# Patient Record
Sex: Female | Born: 1965 | Race: White | Hispanic: No | Marital: Married | Smoking: Never smoker
Health system: Southern US, Community
[De-identification: ages and names within clinical notes are randomized; demographics above are authoritative.]

## PROBLEM LIST (undated history)

## (undated) DIAGNOSIS — R55 Syncope and collapse: Secondary | ICD-10-CM

## (undated) HISTORY — DX: Syncope and collapse: R55

## (undated) HISTORY — PX: PACEMAKER INSERTION: SHX728

## (undated) HISTORY — PX: POCKET REVISION: SHX6032

---

## 2005-10-27 ENCOUNTER — Ambulatory Visit (HOSPITAL_COMMUNITY): Admission: RE | Admit: 2005-10-27 | Discharge: 2005-10-27 | Payer: Self-pay | Admitting: Surgery

## 2005-11-02 ENCOUNTER — Ambulatory Visit: Payer: Self-pay | Admitting: Internal Medicine

## 2005-11-03 ENCOUNTER — Ambulatory Visit: Payer: Self-pay | Admitting: Internal Medicine

## 2005-11-03 ENCOUNTER — Inpatient Hospital Stay (HOSPITAL_COMMUNITY): Admission: RE | Admit: 2005-11-03 | Discharge: 2005-11-04 | Payer: Self-pay | Admitting: Surgery

## 2006-01-11 ENCOUNTER — Ambulatory Visit: Payer: Self-pay | Admitting: Internal Medicine

## 2006-04-24 ENCOUNTER — Ambulatory Visit: Payer: Self-pay | Admitting: Internal Medicine

## 2006-10-17 ENCOUNTER — Ambulatory Visit: Payer: Self-pay | Admitting: Internal Medicine

## 2007-08-25 IMAGING — US US MISC SOFT TISSUE
1 series · 14 of 16 positions shown · non-contrast
Comparison: none

CLINICAL DATA: 39-year-old female with left pacer site focal redness, induration and swelling with pain for 2 days.  This is to exclude superficial abscess.
ULTRASOUND MISCELLANEOUS SOFT TISSUE LEFT CHEST PACER SITE:
TECHNIQUE: Superficial ultrasound is performed of the left chest pacer site with a linear probe.

[Series 1: unknown · 0.11mm/px · 14 of 24 slices shown]
[im 1/24]
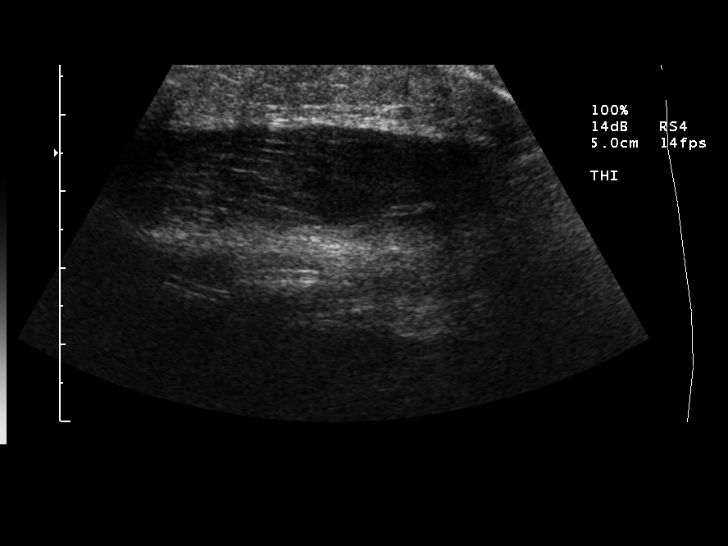
[im 2/24]
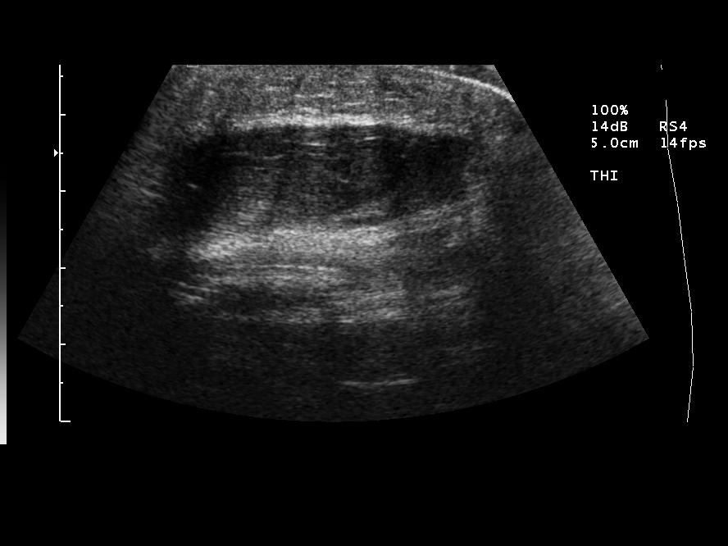
[im 4/24]
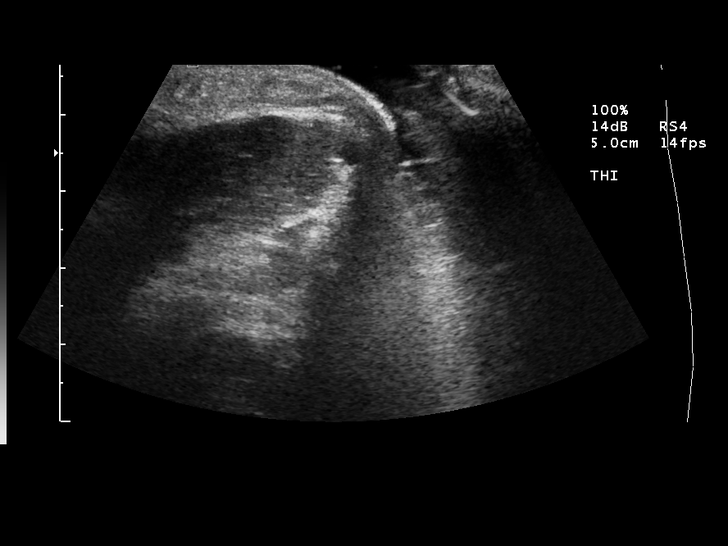
[im 7/24]
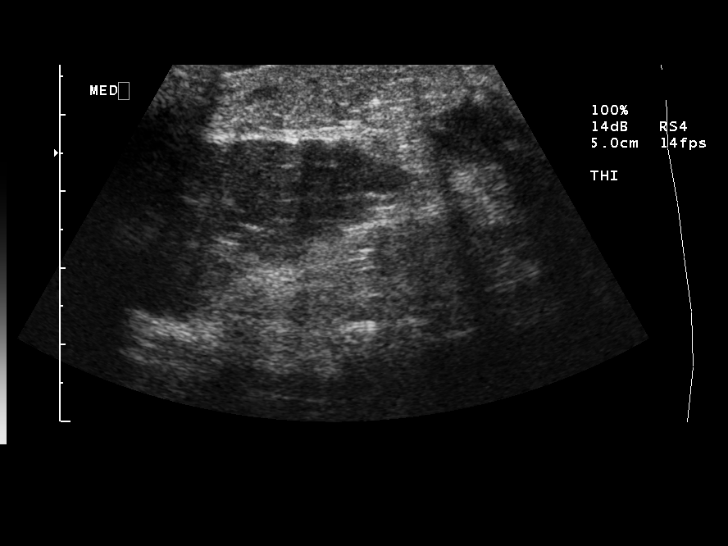
[im 8/24]
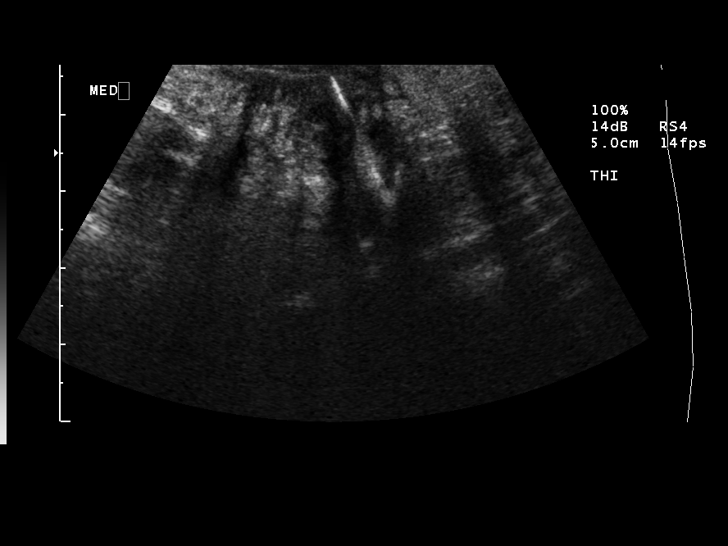
[im 10/24]
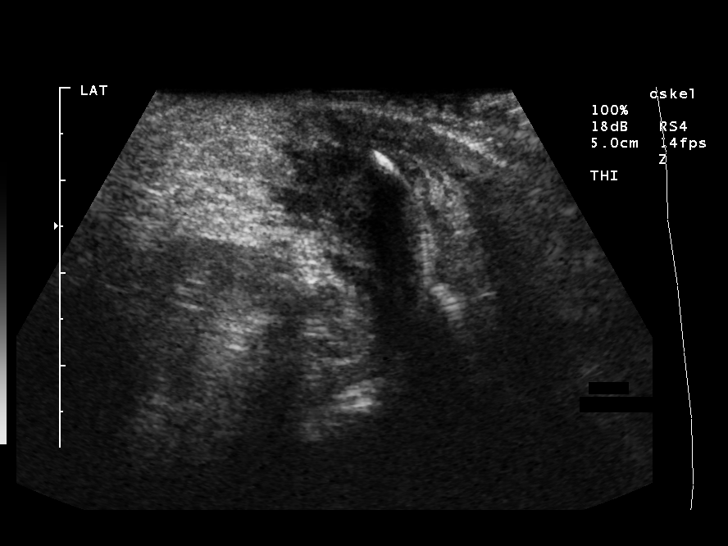
[im 11/24]
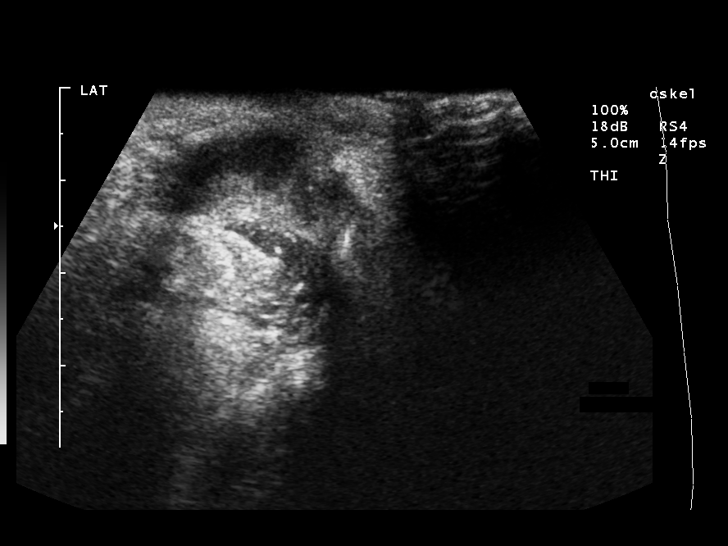
[im 13/24]
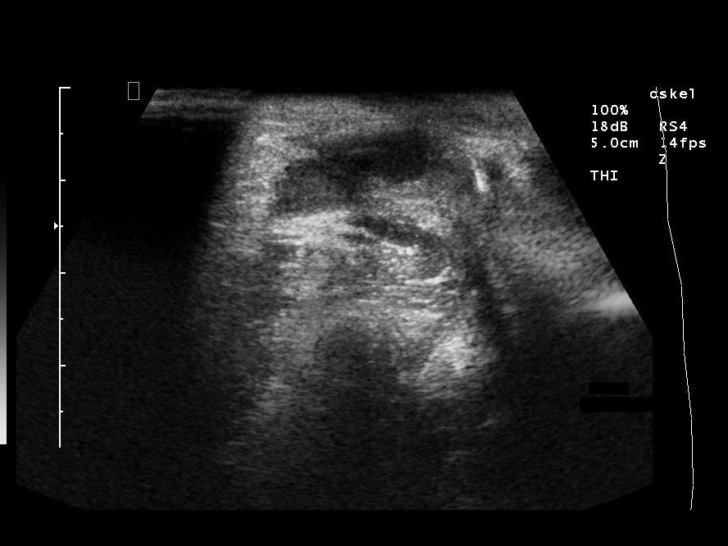
[im 14/24]
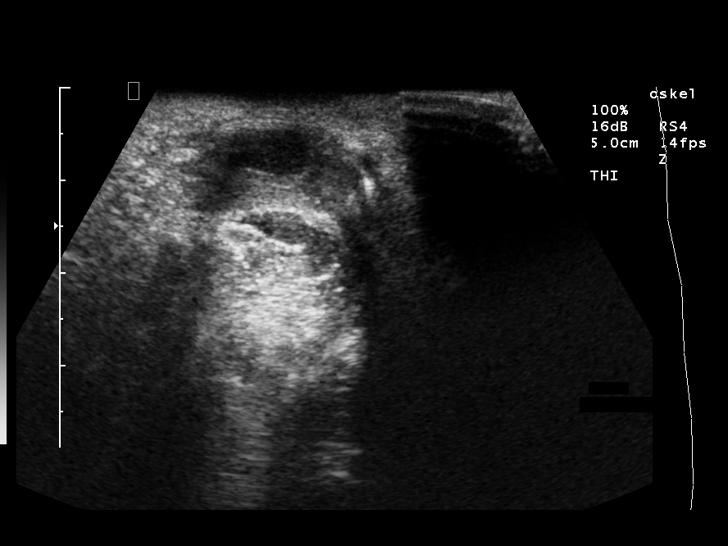
[im 16/24]
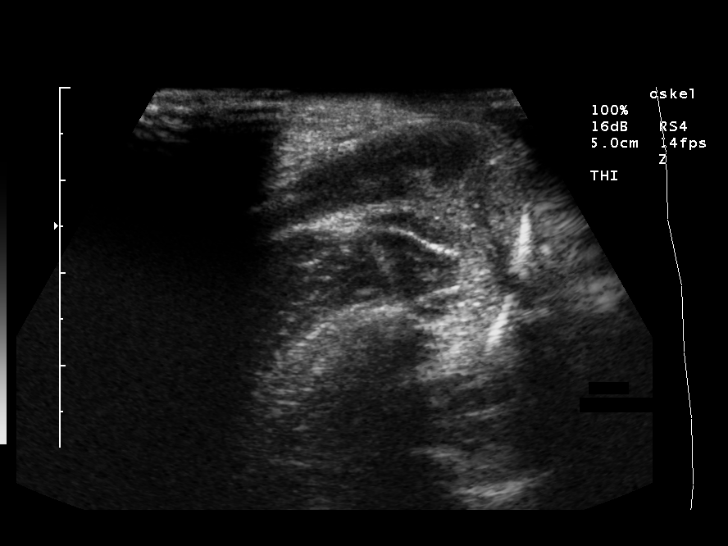
[im 19/24]
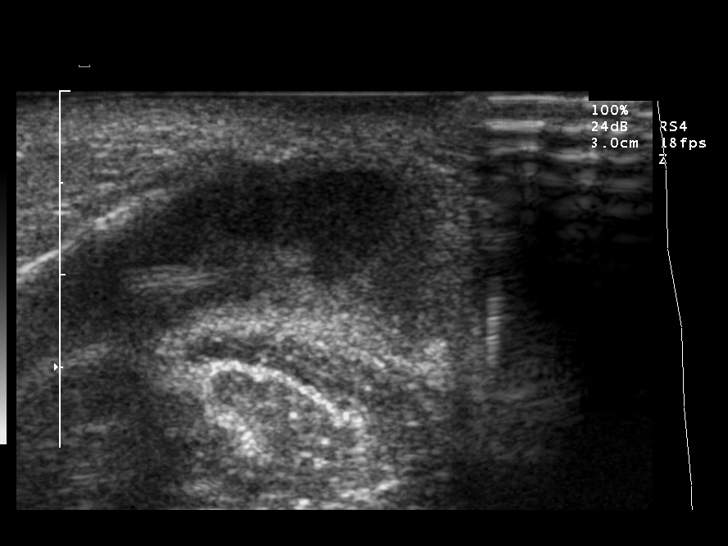
[im 20/24]
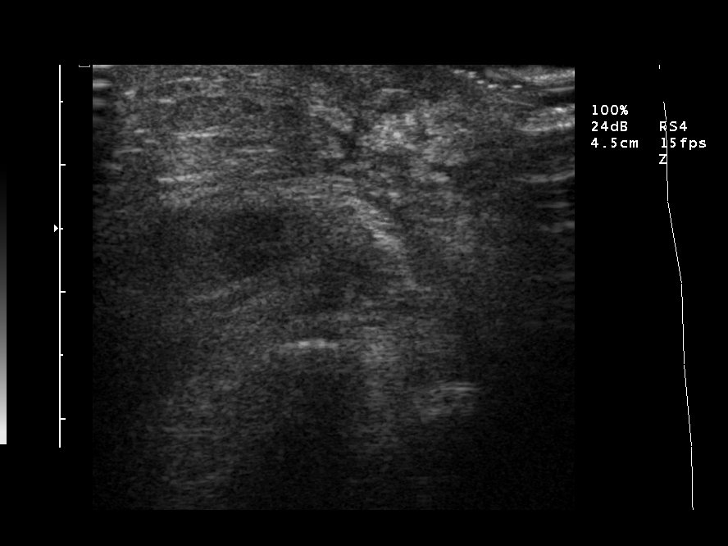
[im 22/24]
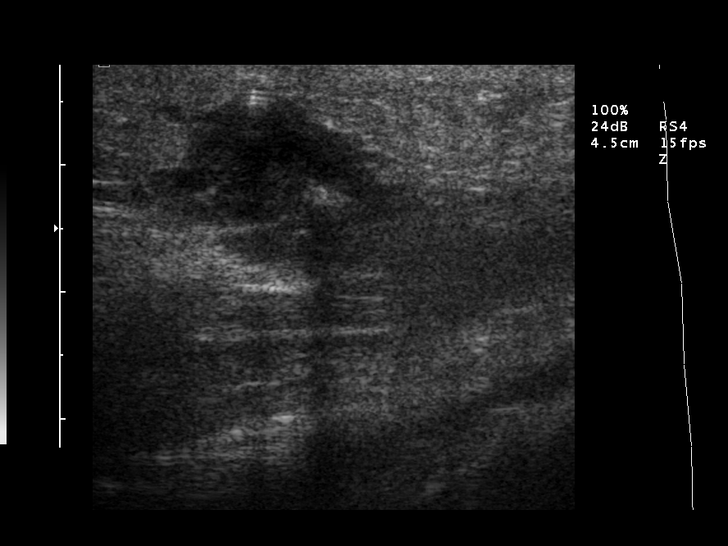
[im 24/24]
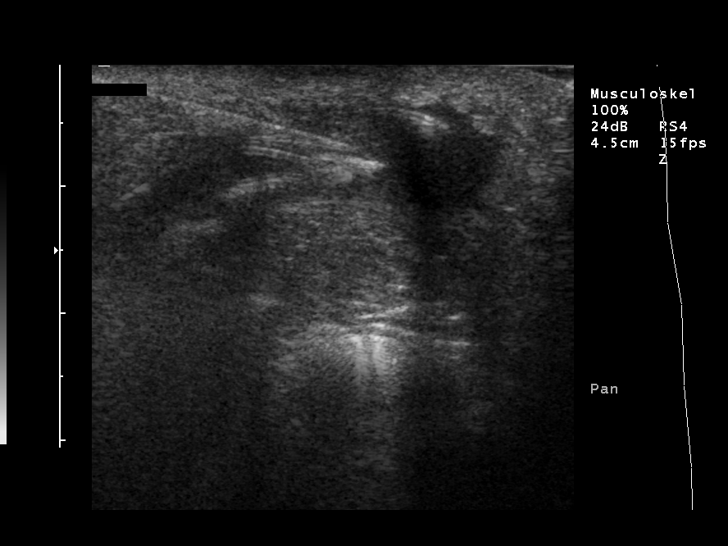

[14 of 16 positions shown; findings below may reference images not displayed]

FINDINGS: The pacemaker is subpectoral in location.  The echogenic pacer wires are easy to follow and lateral to the pacemaker there is a focal fluid collection surrounding both pacer wires within the subcutaneous layer beneath the skin and overlying the axillary portion of the left pectoralis major muscle.  This is directly beneath the area of skin induration and redness consistent with a subcutaneous abscess.
IMPRESSION: Superficial subcutaneous left chest abscess just lateral to the pacemaker site and surrounding both of the pacer wires.  The collection will be aspirated to obtain a sample.  
Note:  These findings were discussed with DR. Shalu Joynal.

## 2008-06-19 ENCOUNTER — Ambulatory Visit: Payer: Self-pay | Admitting: Internal Medicine

## 2010-08-04 ENCOUNTER — Encounter: Payer: Self-pay | Admitting: Internal Medicine

## 2010-08-04 ENCOUNTER — Ambulatory Visit
Admission: RE | Admit: 2010-08-04 | Discharge: 2010-08-04 | Payer: Self-pay | Source: Home / Self Care | Attending: Internal Medicine | Admitting: Internal Medicine

## 2010-08-04 DIAGNOSIS — R55 Syncope and collapse: Secondary | ICD-10-CM | POA: Insufficient documentation

## 2010-08-11 NOTE — Assessment & Plan Note (Signed)
Summary: [facility]  Medications Added * BIRTH CONTROL as directed        Visit Type:  Follow-up Primary Provider:  [facility]  CC:  no complaints.  History of Present Illness: Wanda Barrera returns today for followup. She is a pleasant 45 yo woman with a h/o neurally mediated syncope, who I initially met after she had developed a PPM pocket infection at another institution and underwent system exctraction.  She has not had any recent episodes of syncope. She continues on a high sodium diet. She remains active.  No peripheral edema.  Current Medications (verified): 1)  Fludrocortisone Acetate 0.1 Mg Tabs (Fludrocortisone Acetate) .... 2 Tabs By Mouth As Directed. 2)  Birth Control .... As Directed  Past History:  Past Medical History: Last updated: 08/03/2010  Neurally mediated syncope. Marland Kitchen History of pacemaker implantation secondary to       a.     Complicated by infection, now status post removal.   Family History: Reviewed history and no changes required. Strongly positive for neurally mediated syncope.  Review of Systems  The patient denies chest pain, syncope, dyspnea on exertion, and peripheral edema.    Vital Signs:  Patient profile:   45 year old female Height:      66 inches Weight:      136 pounds BMI:     22.03 Pulse rate:   72 / minute BP sitting:   100 / 68  (left arm) Cuff size:   regular  Vitals Entered By: Burnett Kanaris, CNA (August 04, 2010 11:54 AM)  Physical Exam  General:  Well developed, well nourished, in no acute distress.  HEENT: normal Neck: supple. No JVD. Carotids 2+ bilaterally no bruits Cor: RRR no rubs, gallops or murmur Lungs: CTA. Well healed PPM incision. Ab: soft, nontender. nondistended. No HSM. Good bowel sounds Ext: warm. no cyanosis, clubbing or edema Neuro: alert and oriented. Grossly nonfocal. affect pleasant    Impression & Recommendations:  Problem # 1:  VASOVAGAL SYNCOPE (ICD-780.2) Her symptoms are  well controlled. Will continue current meds and she will maintain a high sodium diet.  Patient Instructions: 1)  Your physician wants you to follow-up in:   2 years with Dr Court Joy will receive a reminder letter in the mail two months in advance. If you don't receive a letter, please call our office to schedule the follow-up appointment. Prescriptions: FLUDROCORTISONE ACETATE 0.1 MG TABS (FLUDROCORTISONE ACETATE) 2 tabs by mouth as directed.  #60 x 11   Entered by:   Dennis Bast, RN, BSN   Authorized by:   Laren Boom, MD, [facility]   Signed by:   Dennis Bast, RN, BSN on 08/04/2010   Method used:   Electronically to        [facility]  [phone]* (retail)       [address]       [place], [place]  [address]       Ph: [phone] or [phone]       Fax: [phone]   RxID:   [id number]

## 2010-09-07 ENCOUNTER — Telehealth: Payer: Self-pay | Admitting: Internal Medicine

## 2010-09-27 NOTE — Progress Notes (Signed)
Summary: refill request   Phone Note Refill Request Message from:  Patient on September 07, 2010 10:44 AM  zofran oral tablet/needs new rx [facility] [address]/pt [phone]   Method Requested: Telephone to Pharmacy Initial call taken by: Glynda Jaeger,  September 07, 2010 10:44 AM  Follow-up for Phone Call        lm for ot to call Follow-up by: Laurance Flatten CMA,  September 09, 2010 5:04 PM

## 2010-11-22 NOTE — Assessment & Plan Note (Signed)
[facility]                         ELECTROPHYSIOLOGY OFFICE NOTE   NAME:WHITTINGTONMadailein, [name]               MRN:          [id number]  DATE:06/19/2008                            DOB:          May 22, 1966    Wanda Barrera returns today for followup.  She is a very pleasant  young woman with a history of neurally mediated syncope, who underwent  permanent pacemaker insertion in the past.  This ultimately resulted in  complication of infection, for which she underwent lead extraction.  The  patient has been stable in the last year and half.  She saw Dr. Georgana Curio and he recommended continuation of her Florinef, which she has  done and she has had no recurrent syncope.  She continues to be quite  active.  She works as an Garment/textile technologist.   PHYSICAL EXAMINATION:  GENERAL:  Notable for a very pleasant well-  appearing young woman in no distress.  VITAL SIGNS:  Blood pressure is 110/72, the pulse is 60 and regular,  respirations were 18, and the weight was 132 pounds.  NECK:  No jugular venous distention.  LUNGS:  Clear bilaterally to auscultation.  No wheezes, rales, or  rhonchi are present.  CARDIOVASCULAR:  Regular rate and rhythm with normal S1 and S2.  No  murmurs, rubs, or gallops are present.  ABDOMEN:  Soft and nontender.  EXTREMITIES:  No edema.   IMPRESSION:  1. Neurally mediated syncope.  2. History of pacemaker implantation secondary to      a.     Complicated by infection, now status post removal.   DISCUSSION:  Overall, Wanda Barrera is stable.  Her syncope has been  well controlled.  I will plan to see the patient back in the office in 1-  2 years.     Doylene Canning. Ladona Ridgel, MD  Electronically Signed    GWT/MedQ  DD: 06/19/2008  DT: 06/20/2008  Job #: [id number]

## 2010-11-25 NOTE — Discharge Summary (Signed)
NAME:  Wanda Barrera, Wanda Barrera NO.:  [id number]   MEDICAL RECORD NO.:  [id number]          PATIENT TYPE:  INP   LOCATION:  2025                         FACILITY:  [facility]   PHYSICIAN:  Tereso Newcomer, P.A.     DATE OF BIRTH:  July 20, 1965   DATE OF ADMISSION:  11/03/2005  DATE OF DISCHARGE:  11/04/2005                                 DISCHARGE SUMMARY   ADDENDUM:  Total physician and P.A. time on this discharge greater than 30 minutes.      Tereso Newcomer, P.A.     SW/MEDQ  D:  11/05/2005  T:  11/06/2005  Job:  [id number]

## 2010-11-25 NOTE — Op Note (Signed)
NAME:  Wanda Barrera, Wanda Barrera NO.:  [id number]   MEDICAL RECORD NO.:  [id number]          PATIENT TYPE:  INP   LOCATION:  2025                         FACILITY:  [facility]   PHYSICIAN:  Doylene Canning. Ladona Ridgel, M.D.  DATE OF BIRTH:  Nov 16, 1965   DATE OF PROCEDURE:  11/03/2005  DATE OF DISCHARGE:                                 OPERATIVE REPORT   PROCEDURE REFORMED:  Extraction of a previously implanted dual-chamber  pacing system which was placed initially in the subpectoralis fascia.   1.  The patient is a 45 year old woman with a history of neurally mediated      syncope with a prominent cardiac inhibitory component.  She underwent      pacemaker insertion nearly 2 years ago with a subpectoral implant placed      at that time.  She notes that approximately 1 month after the implant,      she had migration of the pocket under the axilla.  She ultimately      underwent pocket revision approximately 3-4 months ago with      repositioning of the pocket more medially under the pectoralis fascia.      The patient developed pain and soreness about 2 weeks ago on the      axillary side of the incision and subsequently had an ultrasound guided      needle aspiration which ultimately grew out coagulase negative staph.      She is now referred for pacemaker lead and generator extraction.  Of      note, at the time of surgery, the pacemaker lead is actually, through      the skin in the axillary position.   1.  Procedure.  After informed consent was obtained, the patient was taken      to the operating room in the fasted state.  After usual preparation and      draping, a total of 30 mL of lidocaine was infiltrated over the      pacemaker insertion site.  The patient was placed under general      anesthesia under the direction of Dr. __________.  Surgical backup was      obtained by Dr. Evelene Croon.  A 5 cm incision was carried out over the      axillary portion of the pocket insertion  site.  Electrocautery was      utilized to dissect down to the pacemaker generator.  The subpectoral      pocket was inspected, and electrocautery was utilized to enter the      pocket and free up the fibrous adhesions.  The Medtronic dual-chamber      pacemaker was (Ensure) was subsequently removed from the pocket with      gentle traction.  The electrocautery was utilized to assure hemostasis.      With the generator out of the pocket, the pocket was packed with gauze      initially.  The patient's lead that had initially been placed by way of      a left subclavian venous access site just under the infraclavicular  region, and a total of 20 mL lidocaine was infiltrated over this side,      and a 3 cm incision was carried out just in the infraclavicular region.      The pacemaker sewing sleeve was easily found, and the leads were freed      up at their insertion site to the subclavian vein.  At this point, the      leads were pulled through from the axillary pocket to the      infraclavicular pocket.  The Medtronic stylet was advanced into the      ventricular pacing lead.  The helix of the lead was retracted, and the      body of the lead was counterclockwise rotated, and gentle traction was      then given to the lead, and it came out of the right ventricle through      the venous structures out of the pocket.  In the same way, a made      Medtronic stylet was advanced into the atrial lead, and the atrial helix      was also retracted into the body of the lead.  Again, gentle traction      was given to the lead after the lead itself was counterclockwise      torqued, and it, too, was removed from the right atrium with gentle      traction.  At this point, hemostasis was obtained by holding direct      pressure.  Kanamycin irrigation was utilized to irrigate the      infraclavicular pocket, and the infraclavicular pocket was closed with      Prolene suture.  The axillary pocket was  then inspected, and the gauze      were removed from the pocket.  Electrocautery was utilized to ensure      hemostasis, and kanamycin irrigation was utilized to irrigate the      pocket.  The pocket was inspected, and there were no obvious foreign      materials in the pocket.  At this point, the axillary incision was      closed with a single layer of mattress suture.  Pressure was held, and a      pressure dressing was placed on the pocket, and the patient was returned      to the PACU in satisfactory condition.  Of note, her heart rate remained      stable in the 50-60 range during the procedure.   1.  Complications.  There are no immediate procedure complications.   1.  Results.  This demonstrates successful atrial and ventricular lead      extraction and removal of a previously implanted subpectoral pacemaker      generator in a patient with pacemaker pocket infection.           ______________________________  Doylene Canning. Ladona Ridgel, M.D.     GWT/MEDQ  D:  11/03/2005  T:  11/04/2005  Job:  [id number]   cc:   Luella Cook, MD   Evelene Croon, M.D.  [address]  [place]  [place] [address]

## 2010-11-25 NOTE — Letter (Signed)
October 17, 2006    Georgana Curio, MD  [address]  [place], [place]   RE:  Wanda Barrera, Wanda Barrera  MRN:  [id number]  /  DOB:  September 21, 1965   Dear Dr. Berneice Gandy:   This is a letter of introduction to a patient who I have followed now  for approximately 2 years, who has a history of neurally mediated  syncope.  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  The patient has had a  longstanding history of recurrent episodes of syncope as well as a  strong family history of syncope, in that all 4 of her siblings  routinely pass out.  The patient was seen at the [facility] several years ago and had a tilt table test done at that  time.  I do not have the primary data, but the patient, who is quite  knowledgeable about her illness, reports that she had 12 seconds of  asystole.  At that time, she was initially treated with conservative  measures, i.e., attempts at volume expansion and watchful waiting, but  no medical therapy.  Because she continued to have symptoms, it was  recommended that she undergo permanent pacemaker insertion, which was  done at [facility] in [place].  The patient, who  is quite thin, had requested that a cardiac surgeon do this surgery with  the device being placed subpectorally and ultimately after surgery, the  patient had no recurrent syncope, but did have migration of her pocket  and underwent attempts at pocket revision by another cardiac surgeon in  our region.  I ultimately met the patient after her pacemaker pocket  became infected and was asked to see her regarding pacemaker lead  extraction, which was carried out without particular difficulty.  In  review of her syncopal records, as noted before, she was on no medical  therapy for prevention of her neurally mediated syncope.  Since removal  of her permanent pacemaker, which was performed back in April  2007, she  has been stable.  She does continue to have episodes of syncope and near-  syncope.  She has learned at times she can often avoid her syncopal  episodes by lying down quickly, though she does complain to feeling  fatigued and weak after the episodes.  She notes that when she had her  pacemaker, she did not have any syncope and she carried her pacemaker  for nearly 2 years.   I initially started the patient on combination therapy with Florinef and  Zoloft following removal of her pacemaker and she has been stable, but  did develop headaches on twice-daily Florinef dosing and now is only on  once-daily dosing.  She was intolerant of Zoloft, complaining of fatigue  and weakness and tiredness; she is no longer on Zoloft.   At this point, I have discussed multiple treatment options with the  patient including reimplantation of her pacemaker, but think because of  her relatively young age that additional medical therapy and for the  most part conservative would be most reasonable.  Because of your noted  expertise in this field, I have requested that the patient be seen by  you for additional evaluation.  My hope is that a relative hierarchy of  additional medical therapy might be recommended in hopes that we can  better control her symptoms of neurally mediated syncope.   Please do  not hesitate to contact me for any additional questions  regarding this very pleasant patient.  I realize as well as does the  patient that your clinic is backed up many, many months.  The patient  does have family in [place] and would be very delighted, I think, to come  and see you regarding her neurally mediated syncope.    Sincerely,      Doylene Canning. Ladona Ridgel, MD  Electronically Signed    GWT/MedQ  DD: 10/17/2006  DT: 10/18/2006  Job #: [id number]

## 2010-11-25 NOTE — Assessment & Plan Note (Signed)
[facility]                           ELECTROPHYSIOLOGY OFFICE NOTE   NAME:Wanda Barrera, Wanda Barrera               MRN:          [id number]  DATE:04/24/2006                            DOB:          [birth year]    Wanda Barrera returns today for followup.  She is a very pleasant young  woman with a history of neural mediated syncope who initially underwent  permanent pacemaker insertion several years ago.  She developed migration of  her pocket and had pacemaker pocket revision and subsequently developed an  infection which required extraction of her entire pacing system.  The  patient returns today for followup.  I saw her most recently back in July.  At that time she had no additional syncopal episodes.  She called [place] back on  April 16, 2006, and had had a syncopal episode where she felt it coming on  and was able to sit back down in her bed before she passed out and avoided  any injury.  This is the only episode she has had and in retrospect the  patient noted that she had had a viral type infection several days prior to  this.   PHYSICAL EXAMINATION:  GENERAL:  She is a pleasant, well-appearing, young  woman in no distress.  VITAL SIGNS:  The blood pressure was 117/72 lying with a heart rate of 71.  Five minutes of standing resulted in no change in the blood pressure and a  heart rate of 76 beats per minute.  NECK:  Revealed no jugular venous distention.  LUNGS:  Clear bilaterally auscultation.  CARDIOVASCULAR:  Revealed a regular rate and rhythm with normal S1 and S2.  EXTREMITIES:  Demonstrated no edema.   IMPRESSION:  1. Neural mediated syncope.  2. History of pacemaker insertion, status post extraction secondary to      infection.   DISCUSSION:  While Wanda Barrera was initially hopeful that we would think  about re-implanting her pacemaker on the contralateral side.  I have  recommend a period of watchful waiting.  I have asked that  she increase her  solid and fluid intake.  She will continue on her Florinef and Zoloft.  Hopefully, this will help minimize any additional syncopal episodes.  I did  talk to her extensively about the pros and cons of re-implanting her  pacemaker and while we will certainly consider this, for now we would hold  off on pacemaker implantation unless her symptoms worsen.  I have discussed  all this with the patient and she agrees.            ______________________________  Doylene Canning. Ladona Ridgel, MD    GWT/MedQ  DD:  04/24/2006  DT:  04/25/2006  Job #:  [id number]

## 2010-11-25 NOTE — Discharge Summary (Signed)
NAME:  Wanda Barrera, Wanda Barrera NO.:  [id number]   MEDICAL RECORD NO.:  [id number]          PATIENT TYPE:  INP   LOCATION:  2025                         FACILITY:  [facility]   PHYSICIAN:  Doylene Canning. Ladona Ridgel, M.D.  DATE OF BIRTH:  [birth year]   DATE OF ADMISSION:  11/03/2005  DATE OF DISCHARGE:  11/04/2005                                 DISCHARGE SUMMARY   REASON FOR ADMISSION:  Infected pacer pocket after migration of the  initially implanted subpectoral pocket with revision.   DISCHARGE DIAGNOSES:  1.  History of neurally mediated syncope.  2.  Status post explantation of pacemaker.   HISTORY:  Please see the consult note from Dr. Ladona Ridgel for complete details.  Briefly, this 45 year old female with a history of neurally mediated syncope  underwent pacemaker implantation at [facility] some time in the past.  She had migration of her pacemaker pocket and underwent revision by Dr.  Laneta Simmers in January of 2007.  She then developed pain and swelling and  aspiration of the pocket demonstrated coag negative Staph.  She has been on  Keflex for treatment and saw Dr. Ladona Ridgel for further evaluation.  He  recommended explantation of her pacemaker.   HOSPITAL COURSE:  The patient was brought to [facility] on April  27th electively for explantation of her pacemaker.  This was done by Dr.  Ladona Ridgel.  No immediate complications were noted.  Her post-procedure chest x-  ray showed no pneumothorax.  Dr. Ladona Ridgel saw the patient on the morning of  November 04, 2005, and felt that she was ready to be discharged to home.  She  remained in normal sinus rhythm.  He placed her on Florinef and Zoloft and  recommended a high sodium diet.  She will follow up closely at the [facility] in the next week and a half.   LABORATORY DATA:  White count 9,100, hemoglobin 11.7, hematocrit 33.5,  platelet count 206,000.  INR 0.9.  Sodium 138, potassium 3.7, glucose 101,  BUN 7, creatinine 0.8.  LFTs  okay.  Total protein 7.5, albumin 4.6.  BNP 54.   DISCHARGE MEDICATIONS:  1.  Florinef 0.1 mg daily.  2.  Zoloft 50 mg daily.  3.  Keflex 500 mg as directed until finished.   DIET:  A high salt diet.   WOUND CARE:  The patient should call for any swelling, redness, drainage, or  fever.   FOLLOW UP:  The patient will follow up with the nurses in our pacemaker  clinic in ten to twelve days.  The appointment will be made on a day that  Dr. Ladona Ridgel is there.      Tereso Newcomer, P.A.    ______________________________  Doylene Canning. Ladona Ridgel, M.D.    SW/MEDQ  D:  11/04/2005  T:  11/06/2005  Job:  [id number]   cc:   Evelene Croon, M.D.  [address]  [place]  [place] [address]

## 2010-11-25 NOTE — Assessment & Plan Note (Signed)
[facility]                         ELECTROPHYSIOLOGY OFFICE NOTE   NAME:WHITTINGTONAriatna, Barrera               MRN:          [id number]  DATE:10/17/2006                            DOB:          01/23/1966    Ms. Wanda Barrera returns today for followup.  She is a very pleasant  young woman with a history of neurally mediated syncope, who underwent  permanent pacemaker insertion at an outside institution several years  ago and subsequently developed pocket migration, underwent pocket  revision and ultimately developed pocket infection, requiring pacemaker  extraction.  Once her pacemaker was placed, she really had no recurrent  syncope.  Because of her relatively young age, I have been reluctant to  recommend reimplantation of a permanent pacemaker.  I saw her back I  October and, at that time, she had had several episodes of frank  syncope, but not injured herself.  She had been, in the past, on Zoloft  and Florinef, but was intolerant to Zoloft and, on Florinef 0.1 twice  daily, she developed headaches.  On 0.1 mg daily, she has not had much  in the way of headaches.  The patient returns today for followup.  In  the last six months, she has had two episodes of near-syncope, both of  which she had diverted by lying down quickly, once she felt her symptoms  coming on.  Otherwise, she is stable and continues to work as a Psychologist, forensic.   PHYSICAL EXAM:  On physical exam today, she is a pleasant, well-  appearing, young woman, in no distress.  Blood pressure was 109/70, the  pulse 70 and regular, the respirations were 18.  Weight was 141 pounds.  NECK:  Revealed no jugular venous distention.  LUNGS:  Clear bilaterally to auscultation.  There were no wheezes, rales  or rhonchi.  CARDIOVASCULAR EXAM:  Revealed a regular rate and rhythm with normal S1  and S2.  EXTREMITIES:  Demonstrated no edema.   Her EKG demonstrates sinus rhythm with RSR prime in  V1, but no  diagnostic criteria for right bundle branch block.   IMPRESSION:  1. neurally mediated syncope.  2. History of pacemaker infection, status post extraction.   DISCUSSION:  I have discussed a whole host of issues today with Ms.  Wanda Barrera, one of which is whether or not to proceed with  reimplantation of her pacemaker, and I think at present this would be  contraindicated, although I plan to have her seek a second opinion on  this.   The second issue is the issue of taking Florinef and pregnancy.  The  patient states that she is not trying to get pregnant; however, she is  not trying not to get pregnant.  I have asked that she talk to her OB-  GYN about this in terms of possible teratogenic side effects on 0.1 mg  of Florinef daily.  My sense is that, if she is not trying to prevent  pregnancy, then she should be, or that we should think about stopping  her Florinef.  Patient is not particularly clear about what she is, in  fact, trying to do,  one way or the other.   Finally, because she continues to have symptoms on albeit very little  medication, I have asked that she seek followup and get a second opinion  with regard to her treatment and we will plan to refer her to see Dr.  Georgana Curio in [place], [place], for additional medical recommendations for  treatment of her neurally-mediated syncope.     Doylene Canning. Ladona Ridgel, MD  Electronically Signed    GWT/MedQ  DD: 10/17/2006  DT: 10/17/2006  Job #: [id number]

## 2011-04-07 ENCOUNTER — Telehealth: Payer: Self-pay | Admitting: Internal Medicine

## 2011-04-07 NOTE — Telephone Encounter (Signed)
LMTCB

## 2011-04-07 NOTE — Telephone Encounter (Signed)
Pt needs a call regarding syncope

## 2011-04-07 NOTE — Telephone Encounter (Signed)
I talked with pt. Pt states she has had an URI infection and thought she was getting better. While walking/running her dog yesterday she felt dizzy like she may pass out. Pt laid down on the ground. She feels like she may have been out for less than a minute. Pt states this was similar to previous syncope but not as severe. Pt does not feel she needs an office visit at this time, but wanted Dr Ladona Ridgel to be aware she had this yesterday. Pt states she will monitor her symptoms and call if she has more problems. I will forward to Dr Ladona Ridgel for review.

## 2011-04-20 ENCOUNTER — Other Ambulatory Visit: Payer: Self-pay | Admitting: Internal Medicine

## 2011-04-20 NOTE — Telephone Encounter (Signed)
Reletex Anti nausea Neuro modulating device [facility] is the web site  He has rx Zofran 4mg  oral desolving tablets #10 w/3 refills [facility] on [address]

## 2011-04-20 NOTE — Telephone Encounter (Signed)
Pt called and has a question about 2 prescriptions for motion sickness device that she wears on her wrist.

## 2011-04-21 MED ORDER — ONDANSETRON 4 MG PO TBDP: 4.0000 mg | ORAL_TABLET | Freq: Three times a day (TID) | ORAL | Status: AC | PRN

## 2011-04-21 NOTE — Telephone Encounter (Signed)
Rx sent in and order for Reletex device is out front

## 2011-08-16 ENCOUNTER — Other Ambulatory Visit: Payer: Self-pay | Admitting: Internal Medicine

## 2012-09-05 ENCOUNTER — Other Ambulatory Visit: Payer: Self-pay | Admitting: Internal Medicine

## 2012-09-06 ENCOUNTER — Other Ambulatory Visit: Payer: Self-pay

## 2012-09-06 NOTE — Telephone Encounter (Signed)
WILL GIVE COPY TO MA

## 2013-01-03 ENCOUNTER — Other Ambulatory Visit: Payer: Self-pay | Admitting: Internal Medicine

## 2013-01-07 ENCOUNTER — Telehealth: Payer: Self-pay

## 2013-01-07 NOTE — Telephone Encounter (Signed)
Appt 7/3 with Dr. Ladona Ridgel.

## 2013-01-07 NOTE — Telephone Encounter (Signed)
pharm called to get refill on florinef 0.1 mg give 60 tab with 1 refill but patient needs to come in for an appointment

## 2013-01-09 ENCOUNTER — Encounter: Payer: Self-pay | Admitting: *Deleted

## 2013-01-09 ENCOUNTER — Encounter: Payer: Self-pay | Admitting: Internal Medicine

## 2013-01-09 ENCOUNTER — Other Ambulatory Visit: Payer: Self-pay | Admitting: *Deleted

## 2013-01-09 ENCOUNTER — Ambulatory Visit (INDEPENDENT_AMBULATORY_CARE_PROVIDER_SITE_OTHER): Payer: BC Managed Care – PPO | Admitting: Internal Medicine

## 2013-01-09 VITALS — BP 117/78 | HR 62 | Ht 66.0 in | Wt 132.0 lb

## 2013-01-09 DIAGNOSIS — R55 Syncope and collapse: Secondary | ICD-10-CM

## 2013-01-09 MED ORDER — FLUDROCORTISONE ACETATE 0.1 MG PO TABS: 0.1000 mg | ORAL_TABLET | Freq: Two times a day (BID) | ORAL | Status: DC

## 2013-01-09 NOTE — Telephone Encounter (Signed)
Already done. Patient saw Dr. Ladona Ridgel this morning.

## 2013-01-10 ENCOUNTER — Encounter: Payer: Self-pay | Admitting: Internal Medicine

## 2013-01-10 NOTE — Assessment & Plan Note (Signed)
Her symptoms appear to be well controlled. I will refill her prescription for florinef. She is instructed to remain well hydrated. Increase sodium intake.

## 2013-01-10 NOTE — Progress Notes (Signed)
HPI Wanda Barrera returns today for followup. She is a pleasant 47 yo woman with a h/o autonomic dysfunction, who initially underwent PPM insertion at an outside hospital, developed pocket infection and underwent device extraction. She did not have a new device implanted and has done fairly well with florinef. In the interim, she has been stable. She has had several near syncopal episodes. No chest pain or sob. She exercises vigorously without difficulty. Allergies  Allergen Reactions  . Sulfur      Current Outpatient Prescriptions  Medication Sig Dispense Refill  . fludrocortisone (FLORINEF) 0.1 MG tablet Take 1 tablet (0.1 mg total) by mouth 2 (two) times daily.  180 tablet  6  . OCELLA 3-0.03 MG tablet        No current facility-administered medications for this visit.     Past Medical History  Diagnosis Date  . Syncope     ROS:   All systems reviewed and negative except as noted in the HPI.   Past Surgical History  Procedure Laterality Date  . Pacemaker insertion    . Pocket revision       No family history on file.   History   Social History  . Marital Status: Single    Spouse Name: N/A    Number of Children: N/A  . Years of Education: N/A   Occupational History  . Not on file.   Social History Main Topics  . Smoking status: Never Smoker   . Smokeless tobacco: Not on file  . Alcohol Use: Not on file  . Drug Use: Not on file  . Sexually Active: Not on file   Other Topics Concern  . Not on file   Social History Narrative  . No narrative on file     BP 117/78  Pulse 62  Ht 5\' 6"  (1.676 m)  Wt 132 lb (59.875 kg)  BMI 21.32 kg/m2  Physical Exam:  Well appearing middle aged woman, NAD HEENT: Unremarkable Neck:  No JVD, no thyromegally Lungs:  Clear with no wheezes, rales, or rhonchi HEART:  Regular rate rhythm, no murmurs, no rubs, no clicks Abd:  soft, positive bowel sounds, no organomegally, no rebound, no guarding Ext:  2 plus pulses,  no edema, no cyanosis, no clubbing Skin:  No rashes no nodules Neuro:  CN II through XII intact, motor grossly intact  EKG - nsr  Assess/Plan:

## 2014-01-10 ENCOUNTER — Other Ambulatory Visit: Payer: Self-pay | Admitting: Internal Medicine

## 2015-01-07 ENCOUNTER — Encounter: Payer: Self-pay | Admitting: *Deleted

## 2015-01-12 ENCOUNTER — Ambulatory Visit (INDEPENDENT_AMBULATORY_CARE_PROVIDER_SITE_OTHER): Payer: BLUE CROSS/BLUE SHIELD | Admitting: Internal Medicine

## 2015-01-12 ENCOUNTER — Encounter: Payer: Self-pay | Admitting: Internal Medicine

## 2015-01-12 VITALS — BP 128/72 | HR 85 | Ht 66.0 in | Wt 134.2 lb

## 2015-01-12 DIAGNOSIS — R55 Syncope and collapse: Secondary | ICD-10-CM | POA: Diagnosis not present

## 2015-01-12 MED ORDER — ONDANSETRON 4 MG PO TBDP: 4.0000 mg | ORAL_TABLET | Freq: Three times a day (TID) | ORAL | Status: DC | PRN

## 2015-01-12 MED ORDER — FLUDROCORTISONE ACETATE 0.1 MG PO TABS: ORAL_TABLET | ORAL | Status: DC

## 2015-01-12 NOTE — Assessment & Plan Note (Signed)
She is currently doing well and has had no recurrent syncope. She will continue low dose florinef.

## 2015-01-12 NOTE — Progress Notes (Signed)
      HPI Wanda Barrera returns today for followup. She is a pleasant 49 yo woman with autonomic dysfunction who underwent PPM insertion many years ago at an outside facility and ultimately developed PM infection and underwent extraction. Since then she has done well. She has been absent from our arrhythmia clinic for the past 2 years. During that time she has done well with no syncope. She ran a combination 5K, 10K, half marathon and marathon over 4 days back in January. She has felt well. She has been maintained on low dose florinef. Allergies  Allergen Reactions  . Sulfur     Rash and swelling of hands     Current Outpatient Prescriptions  Medication Sig Dispense Refill  . fludrocortisone (FLORINEF) 0.1 MG tablet TAKE 1 TABLET (0.1 MG TOTAL) BY MOUTH 2 (TWO) TIMES DAILY. 180 tablet 0  . OCELLA 3-0.03 MG tablet Take 1 tablet by mouth daily.     . ondansetron (ZOFRAN-ODT) 4 MG disintegrating tablet Take 4 mg by mouth every 8 (eight) hours as needed for nausea or vomiting.     No current facility-administered medications for this visit.     Past Medical History  Diagnosis Date  . Syncope     ROS:   All systems reviewed and negative except as noted in the HPI.   Past Surgical History  Procedure Laterality Date  . Pacemaker insertion    . Pocket revision       Family History  Problem Relation Age of Onset  . Heart attack Paternal Grandfather   . Hypertension Father   . Stroke Neg Hx   . Diabetes Neg Hx   . Syncope episode Sister   . Syncope episode Brother   . Syncope episode Sister   . Syncope episode Sister   . Syncope episode Sister   . Syncope episode Brother      History   Social History  . Marital Status: Single    Spouse Name: N/A  . Number of Children: N/A  . Years of Education: N/A   Occupational History  . Not on file.   Social History Main Topics  . Smoking status: Never Smoker   . Smokeless tobacco: Not on file  . Alcohol Use: Not on  file  . Drug Use: Not on file  . Sexual Activity: Not on file   Other Topics Concern  . Not on file   Social History Narrative     BP 128/72 mmHg  Pulse 85  Ht 5\' 6"  (1.676 m)  Wt 134 lb 3.2 oz (60.873 kg)  BMI 21.67 kg/m2  Physical Exam:  Well appearing middle aged woman, NAD HEENT: Unremarkable Neck:  6 cm JVD, no thyromegally Back:  No CVA tenderness Lungs:  Clear with no wheezes HEART:  Regular rate rhythm, no murmurs, no rubs, no clicks Abd:  soft, positive bowel sounds, no organomegally, no rebound, no guarding Ext:  2 plus pulses, no edema, no cyanosis, no clubbing Skin:  No rashes no nodules Neuro:  CN II through XII intact, motor grossly intact  EKG - nsr   Assess/Plan:

## 2015-01-12 NOTE — Patient Instructions (Signed)
Medication Instructions: - no changes  Labwork: - none  Procedures/Testing: - none  Follow-Up: - Your physician wants you to follow-up in: 2 years with Dr. Ladona Ridgelaylor. You will receive a reminder letter in the mail two months in advance. If you don't receive a letter, please call our office to schedule the follow-up appointment.  Any Additional Special Instructions Will Be Listed Below (If Applicable). - none

## 2016-02-14 ENCOUNTER — Other Ambulatory Visit: Payer: Self-pay | Admitting: Internal Medicine

## 2016-02-14 DIAGNOSIS — R55 Syncope and collapse: Secondary | ICD-10-CM

## 2016-12-18 ENCOUNTER — Encounter: Payer: Self-pay | Admitting: Internal Medicine

## 2016-12-18 ENCOUNTER — Ambulatory Visit (INDEPENDENT_AMBULATORY_CARE_PROVIDER_SITE_OTHER): Payer: BLUE CROSS/BLUE SHIELD | Admitting: Internal Medicine

## 2016-12-18 DIAGNOSIS — R55 Syncope and collapse: Secondary | ICD-10-CM | POA: Diagnosis not present

## 2016-12-18 MED ORDER — FLUDROCORTISONE ACETATE 0.1 MG PO TABS: 100.0000 ug | ORAL_TABLET | Freq: Two times a day (BID) | ORAL | 6 refills | Status: DC

## 2016-12-18 MED ORDER — ONDANSETRON 4 MG PO TBDP: 4.0000 mg | ORAL_TABLET | Freq: Three times a day (TID) | ORAL | 12 refills | Status: DC | PRN

## 2016-12-18 NOTE — Progress Notes (Signed)
      HPI Wanda Barrera returns today for followup. She is a pleasant 51 yo woman with autonomic dysfunction who underwent PPM insertion many years ago at an outside facility and ultimately developed PM infection and underwent extraction. Since then she has done well. She has been absent from our arrhythmia clinic for the past 2 years. Since we last saw her, she has had a single episode of near syncope, which occurred while working out. She denies medical non-compliance.   Allergies  Allergen Reactions  . Sulfur     Rash and swelling of hands     Current Outpatient Prescriptions  Medication Sig Dispense Refill  . fludrocortisone (FLORINEF) 0.1 MG tablet Take 1 tablet (100 mcg total) by mouth 2 (two) times daily. 180 tablet 6  . OCELLA 3-0.03 MG tablet Take 1 tablet by mouth daily.     . ondansetron (ZOFRAN-ODT) 4 MG disintegrating tablet Take 1 tablet (4 mg total) by mouth every 8 (eight) hours as needed for nausea or vomiting. 20 tablet 12   No current facility-administered medications for this visit.      Past Medical History:  Diagnosis Date  . Syncope     ROS:   All systems reviewed and negative except as noted in the HPI.   Past Surgical History:  Procedure Laterality Date  . PACEMAKER INSERTION    . POCKET REVISION       Family History  Problem Relation Age of Onset  . Heart attack Paternal Grandfather   . Hypertension Father   . Syncope episode Sister   . Syncope episode Brother   . Syncope episode Sister   . Syncope episode Sister   . Syncope episode Sister   . Syncope episode Brother   . Stroke Neg Hx   . Diabetes Neg Hx      Social History   Social History  . Marital status: Single    Spouse name: N/A  . Number of children: N/A  . Years of education: N/A   Occupational History  . Not on file.   Social History Main Topics  . Smoking status: Never Smoker  . Smokeless tobacco: Never Used  . Alcohol use Not on file  . Drug use: Unknown    . Sexual activity: Not on file   Other Topics Concern  . Not on file   Social History Narrative  . No narrative on file     BP - 114/80, P-74, R - 14, Wt. - 135 Physical Exam:  Well appearing middle aged woman, NAD HEENT: Unremarkable Neck:  6 cm JVD, no thyromegally Back:  No CVA tenderness Lungs:  Clear with no wheezes HEART:  Regular rate rhythm, no murmurs, no rubs, no clicks Abd:  soft, positive bowel sounds, no organomegally, no rebound, no guarding Ext:  2 plus pulses, no edema, no cyanosis, no clubbing Skin:  No rashes no nodules Neuro:  CN II through XII intact, motor grossly intact  EKG - nsr   Assess/Plan: 1. Autonomic dysfunction - she has had only a single episode in the past 2 years. She did not lose consciousness completely. She has reduced her dose of florinef to once daily, and I have encouraged her to increase the dose.  2. Nausea - she will take zofran for nausea as needed.   Leonia ReevesGregg Sudeep Scheibel,M.D.

## 2016-12-18 NOTE — Patient Instructions (Signed)
Medication Instructions:  Your physician recommends that you continue on your current medications as directed. Please refer to the Current Medication list given to you today.   Labwork: None Ordered   Testing/Procedures: None Ordered   Follow-Up: Your physician wants you to follow-up in: 2 years with Dr. Taylor. You will receive a reminder letter in the mail two months in advance. If you don't receive a letter, please call our office to schedule the follow-up appointment.   Any Other Special Instructions Will Be Listed Below (If Applicable).     If you need a refill on your cardiac medications before your next appointment, please call your pharmacy.   

## 2018-02-21 ENCOUNTER — Other Ambulatory Visit: Payer: Self-pay | Admitting: Internal Medicine

## 2018-02-21 DIAGNOSIS — R55 Syncope and collapse: Secondary | ICD-10-CM

## 2018-03-21 ENCOUNTER — Other Ambulatory Visit: Payer: Self-pay | Admitting: Internal Medicine

## 2018-03-21 DIAGNOSIS — R55 Syncope and collapse: Secondary | ICD-10-CM

## 2018-03-21 MED ORDER — FLUDROCORTISONE ACETATE 0.1 MG PO TABS: 100.0000 ug | ORAL_TABLET | Freq: Two times a day (BID) | ORAL | 0 refills | Status: DC

## 2018-05-22 ENCOUNTER — Other Ambulatory Visit: Payer: Self-pay | Admitting: Internal Medicine

## 2018-05-22 DIAGNOSIS — R55 Syncope and collapse: Secondary | ICD-10-CM

## 2018-11-15 ENCOUNTER — Other Ambulatory Visit: Payer: Self-pay

## 2018-11-15 DIAGNOSIS — R55 Syncope and collapse: Secondary | ICD-10-CM

## 2018-11-15 MED ORDER — FLUDROCORTISONE ACETATE 0.1 MG PO TABS: 0.1000 mg | ORAL_TABLET | Freq: Two times a day (BID) | ORAL | 3 refills | Status: DC

## 2018-11-15 NOTE — Telephone Encounter (Signed)
Refilled florinef

## 2018-12-04 ENCOUNTER — Telehealth: Payer: Self-pay | Admitting: Internal Medicine

## 2018-12-04 NOTE — Telephone Encounter (Signed)
New message ° ° ° °LMOM for pt to return call about appt on 06.01.20. If pt returns call, please reach out via secure chat. I will speak to pt.  °

## 2018-12-05 NOTE — Telephone Encounter (Signed)
Follow up     Pt returned call about appt. Changed to video visit via doxemity. Pt smart phone number is listed in appt notes.       Virtual Visit Pre-Appointment Phone Call  "(Name), I am calling you today to discuss your upcoming appointment. We are currently trying to limit exposure to the virus that causes COVID-19 by seeing patients at home rather than in the office."  1. "What is the BEST phone number to call the day of the visit?" - include this in appointment notes  2. Do you have or have access to (through a family member/friend) a smartphone with video capability that we can use for your visit?" a. If yes - list this number in appt notes as cell (if different from BEST phone #) and list the appointment type as a VIDEO visit in appointment notes b. If no - list the appointment type as a PHONE visit in appointment notes  3. Confirm consent - "In the setting of the current Covid19 crisis, you are scheduled for a (phone or video) visit with your provider on (date) at (time).  Just as we do with many in-office visits, in order for you to participate in this visit, we must obtain consent.  If you'd like, I can send this to your mychart (if signed up) or email for you to review.  Otherwise, I can obtain your verbal consent now.  All virtual visits are billed to your insurance company just like a normal visit would be.  By agreeing to a virtual visit, we'd like you to understand that the technology does not allow for your provider to perform an examination, and thus may limit your provider's ability to fully assess your condition. If your provider identifies any concerns that need to be evaluated in person, we will make arrangements to do so.  Finally, though the technology is pretty good, we cannot assure that it will always work on either your or our end, and in the setting of a video visit, we may have to convert it to a phone-only visit.  In either situation, we cannot ensure that we  have a secure connection.  Are you willing to proceed?" STAFF: Did the patient verbally acknowledge consent to telehealth visit? Document YES/NO here: YES  4. Advise patient to be prepared - "Two hours prior to your appointment, go ahead and check your blood pressure, pulse, oxygen saturation, and your weight (if you have the equipment to check those) and write them all down. When your visit starts, your provider will ask you for this information. If you have an Apple Watch or Kardia device, please plan to have heart rate information ready on the day of your appointment. Please have a pen and paper handy nearby the day of the visit as well."  5. Give patient instructions for MyChart download to smartphone OR Doximity/Doxy.me as below if video visit (depending on what platform provider is using)  6. Inform patient they will receive a phone call 15 minutes prior to their appointment time (may be from unknown caller ID) so they should be prepared to answer    TELEPHONE CALL NOTE  Wanda Barrera has been deemed a candidate for a follow-up tele-health visit to limit community exposure during the Covid-19 pandemic. I spoke with the patient via phone to ensure availability of phone/video source, confirm preferred email & phone number, and discuss instructions and expectations.  I reminded Wanda Barrera to be prepared with any vital sign  and/or heart rhythm information that could potentially be obtained via home monitoring, at the time of her visit. I reminded Wanda Barrera to expect a phone call prior to her visit.  Ashland P Edwards 12/05/2018 10:14 AM   INSTRUCTIONS FOR DOWNLOADING THE MYCHART APP TO SMARTPHONE  - The patient must first make sure to have activated MyChart and know their login information - If Apple, go to [employer] Store and type in MyChart in the search bar and download the app. If Android, ask patient to go to [employer] Play Store and type in [facility] in the search  bar and download the app. The app is free but as with any other app downloads, their phone may require them to verify saved payment information or Apple/Android password.  - The patient will need to then log into the app with their MyChart username and password, and select [facility] as their healthcare provider to link the account. When it is time for your visit, go to the MyChart app, find appointments, and click Begin Video Visit. Be sure to Select Allow for your device to access the Microphone and Camera for your visit. You will then be connected, and your provider will be with you shortly.  **If they have any issues connecting, or need assistance please contact MyChart service desk [phone])**  **If using a computer, in order to ensure the best quality for their visit they will need to use either of the following Internet Browsers: [employer] Edge, or Google Chrome**  IF USING DOXIMITY or DOXY.ME - The patient will receive a link just prior to their visit by text.     FULL LENGTH CONSENT FOR TELE-HEALTH VISIT   I hereby voluntarily request, consent and authorize [facility] and its employed or contracted physicians, physician assistants, nurse practitioners or other licensed health care professionals (the Practitioner), to provide me with telemedicine health care services (the Services") as deemed necessary by the treating Practitioner. I acknowledge and consent to receive the Services by the Practitioner via telemedicine. I understand that the telemedicine visit will involve communicating with the Practitioner through live audiovisual communication technology and the disclosure of certain medical information by electronic transmission. I acknowledge that I have been given the opportunity to request an in-person assessment or other available alternative prior to the telemedicine visit and am voluntarily participating in the telemedicine visit.  I understand that I have the  right to withhold or withdraw my consent to the use of telemedicine in the course of my care at any time, without affecting my right to future care or treatment, and that the Practitioner or I may terminate the telemedicine visit at any time. I understand that I have the right to inspect all information obtained and/or recorded in the course of the telemedicine visit and may receive copies of available information for a reasonable fee.  I understand that some of the potential risks of receiving the Services via telemedicine include:   Delay or interruption in medical evaluation due to technological equipment failure or disruption;  Information transmitted may not be sufficient (e.g. poor resolution of images) to allow for appropriate medical decision making by the Practitioner; and/or   In rare instances, security protocols could fail, causing a breach of personal health information.  Furthermore, I acknowledge that it is my responsibility to provide information about my medical history, conditions and care that is complete and accurate to the best of my ability. I acknowledge that Practitioner's advice, recommendations, and/or decision may be  based on factors not within their control, such as incomplete or inaccurate data provided by me or distortions of diagnostic images or specimens that may result from electronic transmissions. I understand that the practice of medicine is not an exact science and that Practitioner makes no warranties or guarantees regarding treatment outcomes. I acknowledge that I will receive a copy of this consent concurrently upon execution via email to the email address I last provided but may also request a printed copy by calling the office of [facility].    I understand that my insurance will be billed for this visit.   I have read or had this consent read to me.  I understand the contents of this consent, which adequately explains the benefits and risks of the Services  being provided via telemedicine.   I have been provided ample opportunity to ask questions regarding this consent and the Services and have had my questions answered to my satisfaction.  I give my informed consent for the services to be provided through the use of telemedicine in my medical care  By participating in this telemedicine visit I agree to the above.

## 2018-12-09 ENCOUNTER — Telehealth (INDEPENDENT_AMBULATORY_CARE_PROVIDER_SITE_OTHER): Payer: BLUE CROSS/BLUE SHIELD | Admitting: Internal Medicine

## 2018-12-09 ENCOUNTER — Other Ambulatory Visit: Payer: Self-pay

## 2018-12-09 DIAGNOSIS — R55 Syncope and collapse: Secondary | ICD-10-CM

## 2018-12-09 NOTE — Progress Notes (Signed)
Electrophysiology TeleHealth Note   Due to national recommendations of social distancing due to COVID 19, an audio/video telehealth visit is felt to be most appropriate for this patient at this time.  See MyChart message from today for the patient's consent to telehealth for [facility].   Date:  12/09/2018   ID:  Annye English, DOB [birth year], MRN [id number]  Location: patient's home  Provider location: [address], [place]  Evaluation Performed: Follow-up visit  PCP:  Yvette Rack, MD  Cardiologist:  No primary care provider on file.  Electrophysiologist:  Dr Ladona Ridgel  Chief Complaint:  "I had one bad spell in February. My husband almost made me go to the hospital."  History of Present Illness:    Wanda Barrera is a 53 y.o. female who presents via audio/video conferencing for a telehealth visit today. She has a long h/o neurally mediated syncope, s/p PPM remotely at [facility] who then got infected and had it removed years ago. In the interim she had flu in February and had a particularly bad episode of syncope in the middle of the night when she had gone to the bathroom and was washing her hands. She has had a few dizzy spells since then. She remains on daily florinef. Today, she denies symptoms of palpitations, chest pain, shortness of breath, or lower extremity edema.  The patient is otherwise without complaint today.  The patient denies symptoms of fevers, chills, cough, or new SOB worrisome for COVID 19.  Past Medical History:  Diagnosis Date  . Syncope     Past Surgical History:  Procedure Laterality Date  . PACEMAKER INSERTION    . POCKET REVISION      Current Outpatient Medications  Medication Sig Dispense Refill  . fludrocortisone (FLORINEF) 0.1 MG tablet Take 1 tablet (0.1 mg total) by mouth 2 (two) times daily. 180 tablet 3  . OCELLA 3-0.03 MG tablet Take 1 tablet by mouth daily.     . ondansetron (ZOFRAN-ODT) 4 MG  disintegrating tablet Take 1 tablet (4 mg total) by mouth every 8 (eight) hours as needed for nausea or vomiting. 20 tablet 12   No current facility-administered medications for this visit.     Allergies:   Sulfur   Social History:  The patient  reports that she has never smoked. She has never used smokeless tobacco.   Family History:  The patient's  family history includes Heart attack in her paternal grandfather; Hypertension in her father; Syncope episode in her brother, brother, sister, sister, sister, and sister.   ROS:  Please see the history of present illness.   All other systems are personally reviewed and negative.    Exam:    Vital Signs:  There were no vitals taken for this visit.  Well appearing, alert and conversant, regular work of breathing,  good skin color Eyes- anicteric, neuro- grossly intact, skin- no apparent rash or lesions or cyanosis, mouth- oral mucosa is pink   Labs/Other Tests and Data Reviewed:    Recent Labs: No results found for requested labs within last 8760 hours.   Wt Readings from Last 3 Encounters:  01/12/15 134 lb 3.2 oz (60.9 kg)  01/09/13 132 lb (59.9 kg)  08/04/10 136 lb (61.7 kg)     Other studies personally reviewed:   ASSESSMENT & PLAN:    1.  Neurally mediated syncope - we discussed her bad spell and other symptoms. I instructed her to increase the florinef to bid  if her symptoms get any worse. Also reveiwed what to do if she gets another URI. She has not had syncope with running. 2. COVID 19 screen The patient denies symptoms of COVID 19 at this time.  The importance of social distancing was discussed today.  Follow-up:  6 months Next remote: n/a  Current medicines are reviewed at length with the patient today.   The patient does not have concerns regarding her medicines.  The following changes were made today:  none  Labs/ tests ordered today include: none No orders of the defined types were placed in this encounter.     Patient Risk:  after full review of this patients clinical status, I feel that they are at moderate risk at this time.  Today, I have spent 15 minutes with the patient with telehealth technology discussing all of the above.    Signed, Lewayne BuntingGregg Taylor, MD  12/09/2018 11:43 AM     [facility] [address] [place] [address] [phone] (office) [phone] (fax)

## 2019-12-05 DIAGNOSIS — Z1231 Encounter for screening mammogram for malignant neoplasm of breast: Secondary | ICD-10-CM | POA: Diagnosis not present

## 2020-01-22 DIAGNOSIS — Z79890 Hormone replacement therapy (postmenopausal): Secondary | ICD-10-CM | POA: Diagnosis not present

## 2020-01-22 DIAGNOSIS — Z01419 Encounter for gynecological examination (general) (routine) without abnormal findings: Secondary | ICD-10-CM | POA: Diagnosis not present

## 2020-01-22 DIAGNOSIS — Z1151 Encounter for screening for human papillomavirus (HPV): Secondary | ICD-10-CM | POA: Diagnosis not present

## 2020-04-16 DIAGNOSIS — R55 Syncope and collapse: Secondary | ICD-10-CM

## 2020-04-16 NOTE — Telephone Encounter (Signed)
Discussed with patient over the phone.   She has been having PVCs (by EKG) and palpitations. She had one severe episode with lightheadedness that improved with lying down. They seem to have gotten better as the week has progressed.  She is going to attempt again to send a picture of her rhythm.   She understands to see care in the ED if she has syncope. She will keep her appointment for next Friday. Encouraged hydration and avoidance of caffeine and alcohol.   Casimiro Needle [address]" [place], PA-C  04/16/2020 2:56 PM

## 2020-04-18 ENCOUNTER — Other Ambulatory Visit: Payer: Self-pay | Admitting: Internal Medicine

## 2020-04-18 DIAGNOSIS — R55 Syncope and collapse: Secondary | ICD-10-CM

## 2020-04-20 ENCOUNTER — Other Ambulatory Visit: Payer: Self-pay

## 2020-04-20 ENCOUNTER — Telehealth: Payer: Self-pay | Admitting: *Deleted

## 2020-04-20 DIAGNOSIS — R55 Syncope and collapse: Secondary | ICD-10-CM

## 2020-04-20 DIAGNOSIS — I493 Ventricular premature depolarization: Secondary | ICD-10-CM

## 2020-04-20 NOTE — Telephone Encounter (Signed)
Wanda Barrera is returning your call wanting to discuss the status of her monitor further. Please advise.

## 2020-04-20 NOTE — Telephone Encounter (Signed)
Patient was calling regarding rescheduling her follow up appointment with Gypsy Balsam.  She had been advised, if she was feeling ok, to push out her follow up to after her monitor results come back.  Advised patient that should be in approximately 2 weeks. Explained to patient why order was change from [employer] to [employer], ([employer] does not cover ZIO AT).

## 2020-04-20 NOTE — Progress Notes (Signed)
Per Andee Lineman pts insurance [employer] would not cover ZIO AT live monitor so I will place order for CAR 05 for the patient to wear for 7 days

## 2020-04-20 NOTE — Telephone Encounter (Signed)
Order Placed for Zio AT.  Patient sent instructions via MyChart

## 2020-04-23 ENCOUNTER — Ambulatory Visit (INDEPENDENT_AMBULATORY_CARE_PROVIDER_SITE_OTHER): Payer: BC Managed Care – PPO | Admitting: Nurse Practitioner

## 2020-04-23 ENCOUNTER — Encounter: Payer: Self-pay | Admitting: Nurse Practitioner

## 2020-04-23 ENCOUNTER — Other Ambulatory Visit: Payer: Self-pay

## 2020-04-23 VITALS — BP 110/70 | Temp 79.0°F | Ht 66.0 in | Wt 140.0 lb

## 2020-04-23 DIAGNOSIS — I493 Ventricular premature depolarization: Secondary | ICD-10-CM | POA: Diagnosis not present

## 2020-04-23 DIAGNOSIS — R55 Syncope and collapse: Secondary | ICD-10-CM

## 2020-04-23 LAB — CBC WITH DIFFERENTIAL/PLATELET
Basophils Absolute: 0 10*3/uL (ref 0.0–0.2)
Basos: 1 %
EOS (ABSOLUTE): 0.1 10*3/uL (ref 0.0–0.4)
Eos: 1 %
Hematocrit: 38.7 % (ref 34.0–46.6)
Hemoglobin: 13.3 g/dL (ref 11.1–15.9)
Immature Grans (Abs): 0 10*3/uL (ref 0.0–0.1)
Immature Granulocytes: 0 %
Lymphocytes Absolute: 1.1 10*3/uL (ref 0.7–3.1)
Lymphs: 19 %
MCH: 29.7 pg (ref 26.6–33.0)
MCHC: 34.4 g/dL (ref 31.5–35.7)
MCV: 86 fL (ref 79–97)
Monocytes Absolute: 0.3 10*3/uL (ref 0.1–0.9)
Monocytes: 5 %
Neutrophils Absolute: 4.3 10*3/uL (ref 1.4–7.0)
Neutrophils: 74 %
Platelets: 187 10*3/uL (ref 150–450)
RBC: 4.48 x10E6/uL (ref 3.77–5.28)
RDW: 11.4 % — ABNORMAL LOW (ref 11.7–15.4)
WBC: 5.8 10*3/uL (ref 3.4–10.8)

## 2020-04-23 LAB — BASIC METABOLIC PANEL
BUN/Creatinine Ratio: 14 (ref 9–23)
BUN: 14 mg/dL (ref 6–24)
CO2: 25 mmol/L (ref 20–29)
Calcium: 9.6 mg/dL (ref 8.7–10.2)
Chloride: 103 mmol/L (ref 96–106)
Creatinine, Ser: 0.98 mg/dL (ref 0.57–1.00)
GFR calc Af Amer: 75 mL/min/{1.73_m2} (ref >59)
GFR calc non Af Amer: 65 mL/min/{1.73_m2} (ref >59)
Glucose: 98 mg/dL (ref 65–99)
Potassium: 3.6 mmol/L (ref 3.5–5.2)
Sodium: 141 mmol/L (ref 134–144)

## 2020-04-23 LAB — MAGNESIUM: Magnesium: 2 mg/dL (ref 1.6–2.3)

## 2020-04-23 LAB — TSH: TSH: 1.61 u[IU]/mL (ref 0.450–4.500)

## 2020-04-23 MED ORDER — DILTIAZEM HCL 30 MG PO TABS: ORAL_TABLET | ORAL | 1 refills | Status: AC

## 2020-04-23 NOTE — Progress Notes (Signed)
Electrophysiology Office Note Date: 04/23/2020  ID:  Wanda Barrera, DOB [birth year], MRN [id number]  PCP: Yvette Rack, MD Electrophysiologist: Ladona Ridgel  CC: syncope and palpitations follow up  Wanda Barrera is a 54 y.o. female seen today for Dr Ladona Ridgel.  She presents today for routine electrophysiology followup.She had an episode on 04/12/20 of about 12 hours of bigeminal PVC's and has had persistent PVC's since. These have been captured on 1 lead monitor but not 12 lead.  She has an event monitor pending.  No other recent changes in caffeine, meds, exercise tolerance. She works as a Scientist, clinical (histocompatibility and immunogenetics) at [employer].  She denies chest pain, dyspnea, PND, orthopnea, nausea, vomiting,  syncope, edema, weight gain, or early satiety.  Past Medical History:  Diagnosis Date  . Syncope    Past Surgical History:  Procedure Laterality Date  . PACEMAKER INSERTION    . POCKET REVISION      Current Outpatient Medications  Medication Sig Dispense Refill  . fludrocortisone (FLORINEF) 0.1 MG tablet Take 1 tablet (0.1 mg total) by mouth 2 (two) times daily. Please make overdue appt with Dr. Ladona Ridgel before anymore refills. 1st attempt 60 tablet 0  . ondansetron (ZOFRAN-ODT) 4 MG disintegrating tablet Take 1 tablet (4 mg total) by mouth every 8 (eight) hours as needed for nausea or vomiting. 20 tablet 12   No current facility-administered medications for this visit.    Allergies:   Sulfur   Social History: Social History   Socioeconomic History  . Marital status: Married    Spouse name: Not on file  . Number of children: Not on file  . Years of education: Not on file  . Highest education level: Not on file  Occupational History  . Not on file  Tobacco Use  . Smoking status: Never Smoker  . Smokeless tobacco: Never Used  Substance and Sexual Activity  . Alcohol use: Not on file  . Drug use: Not on file  . Sexual activity: Not on file  Other Topics Concern  . Not on file    Social History Narrative  . Not on file   Social Determinants of Health   Financial Resource Strain:   . Difficulty of Paying Living Expenses: Not on file  Food Insecurity:   . Worried About Programme researcher, broadcasting/film/video in the Last Year: Not on file  . Ran Out of Food in the Last Year: Not on file  Transportation Needs:   . Lack of Transportation (Medical): Not on file  . Lack of Transportation (Non-Medical): Not on file  Physical Activity:   . Days of Exercise per Week: Not on file  . Minutes of Exercise per Session: Not on file  Stress:   . Feeling of Stress : Not on file  Social Connections:   . Frequency of Communication with Friends and Family: Not on file  . Frequency of Social Gatherings with Friends and Family: Not on file  . Attends Religious Services: Not on file  . Active Member of Clubs or Organizations: Not on file  . Attends Banker Meetings: Not on file  . Marital Status: Not on file  Intimate Partner Violence:   . Fear of Current or Ex-Partner: Not on file  . Emotionally Abused: Not on file  . Physically Abused: Not on file  . Sexually Abused: Not on file    Family History: Family History  Problem Relation Age of Onset  . Heart attack Paternal Grandfather   .  Hypertension Father   . Syncope episode Sister   . Syncope episode Brother   . Syncope episode Sister   . Syncope episode Sister   . Syncope episode Sister   . Syncope episode Brother   . Stroke Neg Hx   . Diabetes Neg Hx     Review of Systems: All other systems reviewed and are otherwise negative except as noted above.   Physical Exam: VS:  BP 110/70   Temp (!) 79 F (26.1 C)   Ht 5\' 6"  (1.676 m)   Wt 140 lb (63.5 kg)   SpO2 98%   BMI 22.60 kg/m  , BMI Body mass index is 22.6 kg/m. Wt Readings from Last 3 Encounters:  04/23/20 140 lb (63.5 kg)  01/12/15 134 lb 3.2 oz (60.9 kg)  01/09/13 132 lb (59.9 kg)    GEN- The patient is well appearing, alert and oriented x 3 today.    HEENT: normocephalic, atraumatic; sclera clear, conjunctiva pink; hearing intact; oropharynx clear; neck supple, no JVP Lymph- no cervical lymphadenopathy Lungs- Clear to ausculation bilaterally, normal work of breathing.  No wheezes, rales, rhonchi Heart- Regular rate and rhythm, no murmurs, rubs or gallops, PMI not laterally displaced GI- soft, non-tender, non-distended, bowel sounds present, no hepatosplenomegaly Extremities- no clubbing, cyanosis, or edema; DP/PT/radial pulses 2+ bilaterally MS- no significant deformity or atrophy Skin- warm and dry, no rash or lesion  Psych- euthymic mood, full affect Neuro- strength and sensation are intact   EKG:  EKG is ordered today. The ekg ordered today shows sinus rhythm, normal intervals   Recent Labs: No results found for requested labs within last 8760 hours.    Other studies Reviewed: Additional studies/ records that were reviewed today include: Dr 03/12/13 office notes   Assessment and Plan:  1.  Palpitations Event monitor ordered but pt has not yet received Will await results Labs today Update echo Will cautiously give Diltiazem 30mg  q6 hours prn for persistent symptoms. I have advised to only take if she can lie down afterwards.   2.  Neurally mediated syncope No recurrence of frank syncope    Current medicines are reviewed at length with the patient today.   The patient does not have concerns regarding her medicines.  The following changes were made today:  Add prn diltiazem  Labs/ tests ordered today include: as above No orders of the defined types were placed in this encounter.    Disposition:   Follow up with Dr Lubertha Basque 6-8 weeks    Signed, , NP 04/23/2020 12:38 PM   [facility] [address] [place] [phone] (office) [phone] (fax)

## 2020-04-23 NOTE — Patient Instructions (Addendum)
Medication Instructions:  Your physician has recommended you make the following change in your medication:  -- START Diltaizem (Cardizem) 30 mg - Take 1 tablet (30 mg) by mouth every 4 hours as needed for PVC's and abnormal heart rhythm.-- Please make sure you are able to be lying for at least 1 hour after taking medication. *If you need a refill on your cardiac medications before your next appointment, please call your pharmacy*  Lab Work: Your physician has recommended that you have lab work today: BMET, CBC, TSH, and Magnesium Level If you have labs (blood work) drawn today and your tests are completely normal, you will receive your results only by: Marland Kitchen MyChart Message (if you have MyChart) OR . A paper copy in the mail If you have any lab test that is abnormal or we need to change your treatment, we will call you to review the results.  Testing/Procedures: Your physician has requested that you have an echocardiogram. Echocardiography is a painless test that uses sound waves to create images of your heart. It provides your doctor with information about the size and shape of your heart and how well your heart's chambers and valves are working. This procedure takes approximately one hour. There are no restrictions for this procedure.  Follow-Up: At [facility], you and your health needs are our priority.  As part of our continuing mission to provide you with exceptional heart care, we have created designated Provider Care Teams.  These Care Teams include your primary Cardiologist (physician) and Advanced Practice Providers (APPs -  Physician Assistants and Nurse Practitioners) who all work together to provide you with the care you need, when you need it.  We recommend signing up for the patient portal called "MyChart".  Sign up information is provided on this After Visit Summary.  MyChart is used to connect with patients for Virtual Visits (Telemedicine).  Patients are able to view lab/test  results, encounter notes, upcoming appointments, etc.  Non-urgent messages can be sent to your provider as well.   To learn more about what you can do with MyChart, go to [email or web address].    Your next appointment:   Your physician recommends that you schedule a follow-up appointment in: 6-8 WEEKS with Dr. Ladona Ridgel or EP APP  The format for your next appointment:   In Person with Dr. Ladona Ridgel or one of the following Advanced Practice Providers on your designated Care Team:    Gypsy Balsam, NP  Francis Dowse, PA-C  Casimiro Needle "[place]" [place], [place]

## 2020-04-27 ENCOUNTER — Ambulatory Visit (INDEPENDENT_AMBULATORY_CARE_PROVIDER_SITE_OTHER): Payer: BC Managed Care – PPO

## 2020-04-27 ENCOUNTER — Telehealth: Payer: Self-pay

## 2020-04-27 DIAGNOSIS — R55 Syncope and collapse: Secondary | ICD-10-CM | POA: Diagnosis not present

## 2020-04-27 DIAGNOSIS — I493 Ventricular premature depolarization: Secondary | ICD-10-CM | POA: Diagnosis not present

## 2020-04-27 NOTE — Telephone Encounter (Signed)
-----   Message from Amber K Seiler, NP sent at 04/24/2020  6:26 AM EDT ----- Please notify patient of stable labs. Thanks! 

## 2020-04-27 NOTE — Telephone Encounter (Signed)
Pt is aware and agreeable to stable labs Pt informed me that she has not received her telemetry monitor as of 10/19, I instructed her to reach out to [employer] and if she could not get in contact with them then to contact Andee Lineman.

## 2020-04-27 NOTE — Addendum Note (Signed)
Addended by: Louanne Belton, Felicidad Sugarman A on: 04/27/2020 11:12 AM   Modules accepted: Orders

## 2020-05-17 ENCOUNTER — Other Ambulatory Visit: Payer: Self-pay

## 2020-05-17 ENCOUNTER — Ambulatory Visit (HOSPITAL_COMMUNITY): Payer: BC Managed Care – PPO | Attending: Cardiology

## 2020-05-17 DIAGNOSIS — I493 Ventricular premature depolarization: Secondary | ICD-10-CM

## 2020-05-17 LAB — ECHOCARDIOGRAM COMPLETE
Area-P 1/2: 3.3 cm2
S' Lateral: 2.3 cm

## 2020-05-27 ENCOUNTER — Telehealth: Payer: Self-pay

## 2020-05-27 ENCOUNTER — Other Ambulatory Visit: Payer: Self-pay | Admitting: Internal Medicine

## 2020-05-27 DIAGNOSIS — R55 Syncope and collapse: Secondary | ICD-10-CM | POA: Diagnosis not present

## 2020-05-27 DIAGNOSIS — Z6822 Body mass index (BMI) 22.0-22.9, adult: Secondary | ICD-10-CM | POA: Diagnosis not present

## 2020-05-27 DIAGNOSIS — M47812 Spondylosis without myelopathy or radiculopathy, cervical region: Secondary | ICD-10-CM | POA: Diagnosis not present

## 2020-05-27 DIAGNOSIS — I10 Essential (primary) hypertension: Secondary | ICD-10-CM | POA: Diagnosis not present

## 2020-05-27 NOTE — Telephone Encounter (Signed)
-----   Message from Graciella Freer, PA-C sent at 05/25/2020  2:05 PM EST ----- Please let her know the pumping function of her heart is normal. Good news.   Casimiro Needle [address] [place], PA-C  05/25/2020 2:04 PM

## 2020-05-27 NOTE — Telephone Encounter (Signed)
Pt is aware and agreeable to normal echo results 

## 2020-05-31 ENCOUNTER — Telehealth: Payer: Self-pay

## 2020-05-31 NOTE — Telephone Encounter (Signed)
Left detailed message on patients voicemail Instructed pt to call back if she wanted to discuss study results in detail or has any questions

## 2020-05-31 NOTE — Telephone Encounter (Signed)
-----   Message from Michael Andrew Tillery, PA-C sent at 05/25/2020  2:05 PM EST ----- Please let her know the pumping function of her heart is normal. Good news.   Michael "Andy" Tillery, PA-C  05/25/2020 2:04 PM   

## 2020-06-18 ENCOUNTER — Ambulatory Visit: Payer: Self-pay | Admitting: Licensed Clinical Social Worker

## 2020-06-18 ENCOUNTER — Ambulatory Visit: Payer: BC Managed Care – PPO | Admitting: Nurse Practitioner

## 2020-07-07 ENCOUNTER — Ambulatory Visit: Payer: BC Managed Care – PPO | Admitting: Physician Assistant

## 2020-07-15 DIAGNOSIS — Z6822 Body mass index (BMI) 22.0-22.9, adult: Secondary | ICD-10-CM | POA: Diagnosis not present

## 2020-07-15 DIAGNOSIS — M47812 Spondylosis without myelopathy or radiculopathy, cervical region: Secondary | ICD-10-CM | POA: Diagnosis not present

## 2020-07-15 DIAGNOSIS — R55 Syncope and collapse: Secondary | ICD-10-CM | POA: Diagnosis not present

## 2020-07-15 DIAGNOSIS — I10 Essential (primary) hypertension: Secondary | ICD-10-CM | POA: Diagnosis not present

## 2020-08-26 ENCOUNTER — Encounter: Payer: Self-pay | Admitting: Internal Medicine

## 2020-08-26 ENCOUNTER — Ambulatory Visit (INDEPENDENT_AMBULATORY_CARE_PROVIDER_SITE_OTHER): Payer: BC Managed Care – PPO | Admitting: Internal Medicine

## 2020-08-26 ENCOUNTER — Other Ambulatory Visit: Payer: Self-pay

## 2020-08-26 VITALS — BP 102/64 | HR 73 | Ht 66.0 in | Wt 143.2 lb

## 2020-08-26 DIAGNOSIS — R002 Palpitations: Secondary | ICD-10-CM | POA: Insufficient documentation

## 2020-08-26 DIAGNOSIS — R55 Syncope and collapse: Secondary | ICD-10-CM

## 2020-08-26 NOTE — Progress Notes (Signed)
HPI Wanda Barrera is a 55 y.o. female who presents for followup of autonomic dysfunction. She has a long h/o neurally mediated syncope, s/p PPM remotely at [facility] who then got infected and had it removed years ago. In the interim she had flu a year ago and had a particularly bad episode of syncope in the middle of the night when she had gone to the bathroom and was washing her hands. She has had a few dizzy spells since then. She remains on daily florinef. She had PVC's several months ago but this has resolved.  Allergies  Allergen Reactions  . Elemental Sulfur     Rash and swelling of hands     Current Outpatient Medications  Medication Sig Dispense Refill  . COMBIPATCH 0.05-0.14 MG/DAY Place 1 patch onto the skin 2 (two) times a week.    . diltiazem (CARDIZEM) 30 MG tablet Take 1 tablet by mouth as needed every 4 hours for PVC's and abnormal heart rhythm. Please make sure you are able to be lying for at least 1 hour after taking medication. 60 tablet 1  . fludrocortisone (FLORINEF) 0.1 MG tablet TAKE 1 TABLET (0.1 MG TOTAL) BY MOUTH 2 (TWO) TIMES DAILY. PLEASE MAKE OVERDUE APPT WITH DR. Ladona Ridgel BEFORE ANYMORE REFILLS. 1ST ATTEMPT 180 tablet 3  . ondansetron (ZOFRAN-ODT) 4 MG disintegrating tablet Take 1 tablet (4 mg total) by mouth every 8 (eight) hours as needed for nausea or vomiting. 20 tablet 12   No current facility-administered medications for this visit.     Past Medical History:  Diagnosis Date  . Syncope     ROS:   All systems reviewed and negative except as noted in the HPI.   Past Surgical History:  Procedure Laterality Date  . PACEMAKER INSERTION    . POCKET REVISION       Family History  Problem Relation Age of Onset  . Heart attack Paternal Grandfather   . Hypertension Father   . Syncope episode Sister   . Syncope episode Brother   . Syncope episode Sister   . Syncope episode Sister   . Syncope episode Sister   . Syncope episode Brother    . Stroke Neg Hx   . Diabetes Neg Hx      Social History   Socioeconomic History  . Marital status: Married    Spouse name: Not on file  . Number of children: Not on file  . Years of education: Not on file  . Highest education level: Not on file  Occupational History  . Not on file  Tobacco Use  . Smoking status: Never Smoker  . Smokeless tobacco: Never Used  Substance and Sexual Activity  . Alcohol use: Not on file  . Drug use: Not on file  . Sexual activity: Not on file  Other Topics Concern  . Not on file  Social History Narrative  . Not on file   Social Determinants of Health   Financial Resource Strain: Not on file  Food Insecurity: Not on file  Transportation Needs: Not on file  Physical Activity: Not on file  Stress: Not on file  Social Connections: Not on file  Intimate Partner Violence: Not on file     BP 102/64   Pulse 73   Ht 5\' 6"  (1.676 m)   Wt 143 lb 3.2 oz (65 kg)   SpO2 97%   BMI 23.11 kg/m   Physical Exam:  Well appearing NAD HEENT: Unremarkable Neck:  No JVD, no thyromegally Lymphatics:  No adenopathy Back:  No CVA tenderness Lungs:  Clear with no wheezes HEART:  Regular rate rhythm, no murmurs, no rubs, no clicks Abd:  soft, positive bowel sounds, no organomegally, no rebound, no guarding Ext:  2 plus pulses, no edema, no cyanosis, no clubbing Skin:  No rashes no nodules Neuro:  CN II through XII intact, motor grossly intact  EKG - nsr  Assess/Plan: 1. Autonomic dysfunction - her symptoms are well controlled. She is encouraged to maintain a high sodium diet and daily florinef.   Wanda Gowda Kasie Leccese,MD

## 2020-08-26 NOTE — Patient Instructions (Signed)

## 2020-09-03 DIAGNOSIS — R059 Cough, unspecified: Secondary | ICD-10-CM | POA: Diagnosis not present

## 2020-09-03 DIAGNOSIS — Z20822 Contact with and (suspected) exposure to covid-19: Secondary | ICD-10-CM | POA: Diagnosis not present

## 2020-09-03 DIAGNOSIS — R519 Headache, unspecified: Secondary | ICD-10-CM | POA: Diagnosis not present

## 2020-09-03 DIAGNOSIS — R0981 Nasal congestion: Secondary | ICD-10-CM | POA: Diagnosis not present

## 2020-09-16 DIAGNOSIS — R55 Syncope and collapse: Secondary | ICD-10-CM | POA: Diagnosis not present

## 2020-09-16 DIAGNOSIS — M47812 Spondylosis without myelopathy or radiculopathy, cervical region: Secondary | ICD-10-CM | POA: Diagnosis not present

## 2020-09-22 DIAGNOSIS — M19041 Primary osteoarthritis, right hand: Secondary | ICD-10-CM | POA: Diagnosis not present

## 2020-09-22 DIAGNOSIS — M65312 Trigger thumb, left thumb: Secondary | ICD-10-CM | POA: Diagnosis not present

## 2020-09-22 DIAGNOSIS — M79641 Pain in right hand: Secondary | ICD-10-CM | POA: Diagnosis not present

## 2020-09-22 DIAGNOSIS — M79642 Pain in left hand: Secondary | ICD-10-CM | POA: Diagnosis not present

## 2020-10-13 DIAGNOSIS — U071 COVID-19: Secondary | ICD-10-CM | POA: Diagnosis not present

## 2020-10-15 DIAGNOSIS — U071 COVID-19: Secondary | ICD-10-CM | POA: Diagnosis not present

## 2020-10-27 DIAGNOSIS — M65312 Trigger thumb, left thumb: Secondary | ICD-10-CM | POA: Diagnosis not present

## 2020-10-27 DIAGNOSIS — M151 Heberden's nodes (with arthropathy): Secondary | ICD-10-CM | POA: Diagnosis not present

## 2020-10-27 DIAGNOSIS — M19041 Primary osteoarthritis, right hand: Secondary | ICD-10-CM | POA: Diagnosis not present

## 2020-11-30 DIAGNOSIS — M67449 Ganglion, unspecified hand: Secondary | ICD-10-CM | POA: Diagnosis not present

## 2020-12-03 DIAGNOSIS — L719 Rosacea, unspecified: Secondary | ICD-10-CM | POA: Diagnosis not present

## 2020-12-03 DIAGNOSIS — Z85820 Personal history of malignant melanoma of skin: Secondary | ICD-10-CM | POA: Diagnosis not present

## 2020-12-03 DIAGNOSIS — L570 Actinic keratosis: Secondary | ICD-10-CM | POA: Diagnosis not present

## 2020-12-08 DIAGNOSIS — Z1231 Encounter for screening mammogram for malignant neoplasm of breast: Secondary | ICD-10-CM | POA: Diagnosis not present

## 2021-01-18 DIAGNOSIS — M7711 Lateral epicondylitis, right elbow: Secondary | ICD-10-CM | POA: Diagnosis not present

## 2021-01-18 DIAGNOSIS — M79672 Pain in left foot: Secondary | ICD-10-CM | POA: Diagnosis not present

## 2021-01-18 DIAGNOSIS — G8929 Other chronic pain: Secondary | ICD-10-CM | POA: Diagnosis not present

## 2021-01-28 DIAGNOSIS — Z01419 Encounter for gynecological examination (general) (routine) without abnormal findings: Secondary | ICD-10-CM | POA: Diagnosis not present

## 2021-01-28 DIAGNOSIS — Z79890 Hormone replacement therapy (postmenopausal): Secondary | ICD-10-CM | POA: Diagnosis not present

## 2021-02-18 DIAGNOSIS — R197 Diarrhea, unspecified: Secondary | ICD-10-CM | POA: Diagnosis not present

## 2021-06-23 DIAGNOSIS — Z20822 Contact with and (suspected) exposure to covid-19: Secondary | ICD-10-CM | POA: Diagnosis not present

## 2021-06-23 DIAGNOSIS — J22 Unspecified acute lower respiratory infection: Secondary | ICD-10-CM | POA: Diagnosis not present

## 2021-08-04 ENCOUNTER — Other Ambulatory Visit: Payer: Self-pay | Admitting: Internal Medicine

## 2021-08-04 DIAGNOSIS — R55 Syncope and collapse: Secondary | ICD-10-CM

## 2021-09-12 NOTE — Progress Notes (Signed)
? ?PCP:  Darlis Loan, MD ?Primary Cardiologist: None ?Electrophysiologist: Cristopher Peru, MD  ? ?Wanda Barrera is a 56 y.o. female seen today for Cristopher Peru, MD for routine electrophysiology followup.  Since last being seen in our clinic the patient reports doing well overall. She had one syncopal episode in December in the setting of viral illness. Otherwise she is doing very well. Infrequent palpitations associated with her known history of PVCs. Denies peripheral edema or need for compression garments.  she denies chest pain, palpitations, dyspnea, PND, orthopnea, nausea, vomiting, dizziness, edema, weight gain, or early satiety. ? ?Past Medical History:  ?Diagnosis Date  ? Syncope   ? ?Past Surgical History:  ?Procedure Laterality Date  ? PACEMAKER INSERTION    ? POCKET REVISION    ? ? ?Current Outpatient Medications  ?Medication Sig Dispense Refill  ? COMBIPATCH 0.05-0.14 MG/DAY Place 1 patch onto the skin 2 (two) times a week.    ? diltiazem (CARDIZEM) 30 MG tablet Take 1 tablet by mouth as needed every 4 hours for PVC's and abnormal heart rhythm. Please make sure you are able to be lying for at least 1 hour after taking medication. 60 tablet 1  ? fludrocortisone (FLORINEF) 0.1 MG tablet TAKE 1 TABLET (0.1 MG TOTAL) BY MOUTH 2 (TWO) TIMES DAILY. PLEASE MAKE OVERDUE APPT WITH DR. Lovena Le BEFORE ANYMORE REFILLS. 1ST ATTEMPT 180 tablet 0  ? ondansetron (ZOFRAN-ODT) 4 MG disintegrating tablet Take 1 tablet (4 mg total) by mouth every 8 (eight) hours as needed for nausea or vomiting. 20 tablet 12  ? ?No current facility-administered medications for this visit.  ? ? ?Allergies  ?Allergen Reactions  ? Elemental Sulfur   ?  Rash and swelling of hands  ? ? ?Social History  ? ?Socioeconomic History  ? Marital status: Married  ?  Spouse name: Not on file  ? Number of children: Not on file  ? Years of education: Not on file  ? Highest education level: Not on file  ?Occupational History  ? Not on file   ?Tobacco Use  ? Smoking status: Never  ? Smokeless tobacco: Never  ?Substance and Sexual Activity  ? Alcohol use: Not on file  ? Drug use: Not on file  ? Sexual activity: Not on file  ?Other Topics Concern  ? Not on file  ?Social History Narrative  ? Not on file  ? ?Social Determinants of Health  ? ?Financial Resource Strain: Not on file  ?Food Insecurity: Not on file  ?Transportation Needs: Not on file  ?Physical Activity: Not on file  ?Stress: Not on file  ?Social Connections: Not on file  ?Intimate Partner Violence: Not on file  ? ? ? ?Review of Systems: ?All other systems reviewed and are otherwise negative except as noted above. ? ?Physical Exam: ?Vitals:  ? 09/19/21 1141  ?BP: 118/66  ?Pulse: 71  ?SpO2: 99%  ?Weight: 134 lb 12.8 oz (61.1 kg)  ?Height: 5\' 6"  (1.676 m)  ? ? ?GEN- The patient is well appearing, alert and oriented x 3 today.   ?HEENT: normocephalic, atraumatic; sclera clear, conjunctiva pink; hearing intact; oropharynx clear; neck supple, no JVP ?Lymph- no cervical lymphadenopathy ?Lungs- Clear to ausculation bilaterally, normal work of breathing.  No wheezes, rales, rhonchi ?Heart- Regular rate and rhythm, no murmurs, rubs or gallops, PMI not laterally displaced ?GI- soft, non-tender, non-distended, bowel sounds present, no hepatosplenomegaly ?Extremities- no clubbing, cyanosis, or edema; DP/PT/radial pulses 2+ bilaterally ?MS- no significant deformity or atrophy ?Skin- warm  and dry, no rash or lesion ?Psych- euthymic mood, full affect ?Neuro- strength and sensation are intact ? ?EKG is ordered. Personal review of EKG from today shows NSR at 71 bpm ? ?Additional studies reviewed include: ?Previous EP office notes.  ? ?Assessment and Plan: ? ?1. Palpitations/PVCs ?Echo 05/2020 with normal EF ?Monitor with primarily sinus brady and sinus tach, but PVCs as well ? ?2. Neurally medicated syncope ?Continue florinef ?We discussed the role of salt and water repletion, the importance of exercise, often  needing to be started in the recumbent position, and the awareness of triggers and the role of ambient heat and dehydration ? ?Follow up with Dr. Lovena Le in 12 months  ? ?Shirley Friar, PA-C  ?09/19/21 ?11:54 AM  ?

## 2021-09-19 ENCOUNTER — Other Ambulatory Visit: Payer: Self-pay

## 2021-09-19 ENCOUNTER — Ambulatory Visit (INDEPENDENT_AMBULATORY_CARE_PROVIDER_SITE_OTHER): Payer: BC Managed Care – PPO | Admitting: Student

## 2021-09-19 VITALS — BP 118/66 | HR 71 | Ht 66.0 in | Wt 134.8 lb

## 2021-09-19 DIAGNOSIS — R002 Palpitations: Secondary | ICD-10-CM

## 2021-09-19 DIAGNOSIS — I493 Ventricular premature depolarization: Secondary | ICD-10-CM | POA: Diagnosis not present

## 2021-09-19 DIAGNOSIS — R55 Syncope and collapse: Secondary | ICD-10-CM | POA: Diagnosis not present

## 2021-09-21 ENCOUNTER — Other Ambulatory Visit (INDEPENDENT_AMBULATORY_CARE_PROVIDER_SITE_OTHER): Payer: BC Managed Care – PPO

## 2021-09-21 DIAGNOSIS — R002 Palpitations: Secondary | ICD-10-CM | POA: Diagnosis not present

## 2021-10-17 DIAGNOSIS — M79642 Pain in left hand: Secondary | ICD-10-CM | POA: Diagnosis not present

## 2021-10-17 DIAGNOSIS — T63444A Toxic effect of venom of bees, undetermined, initial encounter: Secondary | ICD-10-CM | POA: Diagnosis not present

## 2021-10-17 DIAGNOSIS — R21 Rash and other nonspecific skin eruption: Secondary | ICD-10-CM | POA: Diagnosis not present

## 2021-10-17 DIAGNOSIS — M7989 Other specified soft tissue disorders: Secondary | ICD-10-CM | POA: Diagnosis not present

## 2021-11-12 ENCOUNTER — Other Ambulatory Visit: Payer: Self-pay | Admitting: Internal Medicine

## 2021-11-12 DIAGNOSIS — R55 Syncope and collapse: Secondary | ICD-10-CM

## 2021-12-02 DIAGNOSIS — L84 Corns and callosities: Secondary | ICD-10-CM | POA: Diagnosis not present

## 2021-12-02 DIAGNOSIS — L821 Other seborrheic keratosis: Secondary | ICD-10-CM | POA: Diagnosis not present

## 2021-12-02 DIAGNOSIS — D225 Melanocytic nevi of trunk: Secondary | ICD-10-CM | POA: Diagnosis not present

## 2021-12-12 DIAGNOSIS — Z1231 Encounter for screening mammogram for malignant neoplasm of breast: Secondary | ICD-10-CM | POA: Diagnosis not present

## 2022-01-31 DIAGNOSIS — Z79890 Hormone replacement therapy (postmenopausal): Secondary | ICD-10-CM | POA: Diagnosis not present

## 2022-01-31 DIAGNOSIS — Z01419 Encounter for gynecological examination (general) (routine) without abnormal findings: Secondary | ICD-10-CM | POA: Diagnosis not present

## 2022-05-03 DIAGNOSIS — H5213 Myopia, bilateral: Secondary | ICD-10-CM | POA: Diagnosis not present

## 2022-05-03 DIAGNOSIS — H524 Presbyopia: Secondary | ICD-10-CM | POA: Diagnosis not present

## 2022-07-31 DIAGNOSIS — Z86006 Personal history of melanoma in-situ: Secondary | ICD-10-CM | POA: Diagnosis not present

## 2022-07-31 DIAGNOSIS — L700 Acne vulgaris: Secondary | ICD-10-CM | POA: Diagnosis not present

## 2022-07-31 DIAGNOSIS — Z1283 Encounter for screening for malignant neoplasm of skin: Secondary | ICD-10-CM | POA: Diagnosis not present

## 2022-07-31 DIAGNOSIS — Z808 Family history of malignant neoplasm of other organs or systems: Secondary | ICD-10-CM | POA: Diagnosis not present

## 2022-07-31 DIAGNOSIS — D229 Melanocytic nevi, unspecified: Secondary | ICD-10-CM | POA: Diagnosis not present

## 2022-12-09 ENCOUNTER — Other Ambulatory Visit: Payer: Self-pay | Admitting: Student

## 2022-12-09 DIAGNOSIS — R55 Syncope and collapse: Secondary | ICD-10-CM

## 2022-12-15 DIAGNOSIS — Z1231 Encounter for screening mammogram for malignant neoplasm of breast: Secondary | ICD-10-CM | POA: Diagnosis not present

## 2023-02-05 DIAGNOSIS — Z79890 Hormone replacement therapy (postmenopausal): Secondary | ICD-10-CM | POA: Diagnosis not present

## 2023-02-05 DIAGNOSIS — Z780 Asymptomatic menopausal state: Secondary | ICD-10-CM | POA: Diagnosis not present

## 2023-02-05 DIAGNOSIS — Z01419 Encounter for gynecological examination (general) (routine) without abnormal findings: Secondary | ICD-10-CM | POA: Diagnosis not present

## 2023-02-21 ENCOUNTER — Ambulatory Visit: Payer: BC Managed Care – PPO | Admitting: Internal Medicine

## 2023-03-07 ENCOUNTER — Ambulatory Visit: Payer: BC Managed Care – PPO | Attending: Internal Medicine | Admitting: Internal Medicine

## 2023-03-07 ENCOUNTER — Encounter: Payer: Self-pay | Admitting: Internal Medicine

## 2023-03-07 VITALS — BP 114/64 | HR 71 | Ht 66.0 in | Wt 132.8 lb

## 2023-03-07 DIAGNOSIS — R002 Palpitations: Secondary | ICD-10-CM | POA: Diagnosis not present

## 2023-03-07 MED ORDER — ONDANSETRON 4 MG PO TBDP: 4.0000 mg | ORAL_TABLET | Freq: Three times a day (TID) | ORAL | 12 refills | Status: AC | PRN

## 2023-03-07 NOTE — Patient Instructions (Signed)

## 2023-03-07 NOTE — Progress Notes (Signed)
HPI Wanda Barrera is a 56 y.o. female who presents for followup of autonomic dysfunction. 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 In the interim she has done well with with only a single episode of near syncope. She has had a few dizzy spells as well. She remains on daily florinef. She had PVC's several months ago but this has resolved. She works out regularly. Allergies  Allergen Reactions   Elemental Sulfur     Rash and swelling of hands     Current Outpatient Medications  Medication Sig Dispense Refill   COMBIPATCH 0.05-0.14 MG/DAY Place 1 patch onto the skin 2 (two) times a week.     diltiazem (CARDIZEM) 30 MG tablet Take 1 tablet by mouth as needed every 4 hours for PVC's and abnormal heart rhythm. Please make sure you are able to be lying for at least 1 hour after taking medication. 60 tablet 1   fludrocortisone (FLORINEF) 0.1 MG tablet TAKE 1 TABLET BY MOUTH 2 TIMES DAILY. 180 tablet 2   ondansetron (ZOFRAN-ODT) 4 MG disintegrating tablet Take 1 tablet (4 mg total) by mouth every 8 (eight) hours as needed for nausea or vomiting. 20 tablet 12   No current facility-administered medications for this visit.     Past Medical History:  Diagnosis Date   Syncope     ROS:   All systems reviewed and negative except as noted in the HPI.   Past Surgical History:  Procedure Laterality Date   PACEMAKER INSERTION     POCKET REVISION       Family History  Problem Relation Age of Onset   Heart attack Paternal Grandfather    Hypertension Father    Syncope episode Sister    Syncope episode Brother    Syncope episode Sister    Syncope episode Sister    Syncope episode Sister    Syncope episode Brother    Stroke Neg Hx    Diabetes Neg Hx      Social History   Socioeconomic History   Marital status: Married    Spouse name: Not on file   Number of children: Not on file    Years of education: Not on file   Highest education level: Not on file  Occupational History   Not on file  Tobacco Use   Smoking status: Never   Smokeless tobacco: Never  Substance and Sexual Activity   Alcohol use: Not on file   Drug use: Not on file   Sexual activity: Not on file  Other Topics Concern   Not on file  Social History Narrative   Not on file   Social Determinants of Health   Financial Resource Strain: Not on file  Food Insecurity: Not on file  Transportation Needs: Not on file  Physical Activity: Not on file  Stress: Not on file  Social Connections: Unknown (11/22/2021)   Received [facility]   Social Network    Social Network: Not on file  Intimate Partner Violence: Unknown (10/13/2021)   Received from [facility]   HITS    Physically Hurt: Not on file    Insult or Talk Down To: Not on file    Threaten Physical Harm: Not on file    Scream or Curse: Not on file     BP 114/64   Pulse 71   Ht 5\' 6"  (1.676 m)  Wt 132 lb 12.8 oz (60.2 kg)   SpO2 99%   BMI 21.43 kg/m   Physical Exam:  Well appearing NAD HEENT: Unremarkable Neck:  No JVD, no thyromegally Lymphatics:  No adenopathy Back:  No CVA tenderness Lungs:  Clear with no wheezes HEART:  Regular rate rhythm, no murmurs, no rubs, no clicks Abd:  soft, positive bowel sounds, no organomegally, no rebound, no guarding Ext:  2 plus pulses, no edema, no cyanosis, no clubbing Skin:  No rashes no nodules Neuro:  CN II through XII intact, motor grossly intact  EKG - nsr   Assess/Plan:  1. Autonomic dysfunction - her symptoms are well controlled. She is encouraged to maintain a high sodium diet and daily florinef.     Sharlot Gowda Nichola Cieslinski,MD

## 2023-05-07 DIAGNOSIS — H524 Presbyopia: Secondary | ICD-10-CM | POA: Diagnosis not present

## 2023-05-07 DIAGNOSIS — H5213 Myopia, bilateral: Secondary | ICD-10-CM | POA: Diagnosis not present

## 2024-04-09 ENCOUNTER — Encounter: Payer: Self-pay | Admitting: Internal Medicine

## 2024-04-09 DIAGNOSIS — R55 Syncope and collapse: Secondary | ICD-10-CM

## 2024-04-24 ENCOUNTER — Encounter: Payer: Self-pay | Admitting: Internal Medicine

## 2024-04-24 ENCOUNTER — Ambulatory Visit: Payer: PRIVATE HEALTH INSURANCE | Attending: Internal Medicine | Admitting: Internal Medicine

## 2024-04-24 VITALS — BP 100/60 | HR 54 | Ht 66.0 in | Wt 133.4 lb

## 2024-04-24 DIAGNOSIS — R55 Syncope and collapse: Secondary | ICD-10-CM | POA: Diagnosis not present

## 2024-04-24 DIAGNOSIS — R002 Palpitations: Secondary | ICD-10-CM | POA: Diagnosis not present

## 2024-04-24 NOTE — Progress Notes (Signed)
 HPI Wanda Barrera is a 58 y.o. female who presents for followup of autonomic dysfunction. 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 In the interim she has done well with with only a single episode of near syncope since her last visit which occurred after she had a bout of food poisoning.  She has had a few dizzy spells as well. She remains on once daily florinef . She had PVC's several months ago but this has resolved. She works out regularly.  Allergies  Allergen Reactions   Sulfa Antibiotics Dermatitis and Itching   Elemental Sulfur     Rash and swelling of hands     Current Outpatient Medications  Medication Sig Dispense Refill   COMBIPATCH 0.05-0.14 MG/DAY Place 1 patch onto the skin 2 (two) times a week.     diltiazem  (CARDIZEM ) 30 MG tablet Take 1 tablet by mouth as needed every 4 hours for PVC's and abnormal heart rhythm. Please make sure you are able to be lying for at least 1 hour after taking medication. 60 tablet 1   fludrocortisone  (FLORINEF ) 0.1 MG tablet TAKE 1 TABLET BY MOUTH 2 TIMES DAILY. 180 tablet 2   ondansetron  (ZOFRAN -ODT) 4 MG disintegrating tablet Take 1 tablet (4 mg total) by mouth every 8 (eight) hours as needed for nausea or vomiting. 20 tablet 12   No current facility-administered medications for this visit.     Past Medical History:  Diagnosis Date   Syncope     ROS:   All systems reviewed and negative except as noted in the HPI.   Past Surgical History:  Procedure Laterality Date   PACEMAKER INSERTION     POCKET REVISION       Family History  Problem Relation Age of Onset   Heart attack Paternal Grandfather    Hypertension Father    Syncope episode Sister    Syncope episode Brother    Syncope episode Sister    Syncope episode Sister    Syncope episode Sister    Syncope episode Brother    Stroke Neg Hx    Diabetes Neg Hx      Social  History   Socioeconomic History   Marital status: Married    Spouse name: Not on file   Number of children: Not on file   Years of education: Not on file   Highest education level: Not on file  Occupational History   Not on file  Tobacco Use   Smoking status: Never   Smokeless tobacco: Never  Substance and Sexual Activity   Alcohol use: Not on file   Drug use: Not on file   Sexual activity: Not on file  Other Topics Concern   Not on file  Social History Narrative   Not on file   Social Drivers of Health   Financial Resource Strain: Not on file  Food Insecurity: Not on file  Transportation Needs: Not on file  Physical Activity: Not on file  Stress: Not on file  Social Connections: Unknown (11/22/2021)   Received from [facility]   Social Network    Social Network: Not on file  Intimate Partner Violence: Unknown (10/13/2021)   Received from [facility]   HITS    Physically Hurt: Not on file    Insult or Talk Down To: Not on file    Threaten Physical Harm: Not on file  Scream or Curse: Not on file     BP 100/60 (BP Location: Left Arm, Patient Position: Sitting, Cuff Size: Normal)   Pulse (!) 54   Ht 5' 6 (1.676 m)   Wt 133 lb 6.4 oz (60.5 kg)   SpO2 100%   BMI 21.53 kg/m   Physical Exam:  Well appearing NAD HEENT: Unremarkable Neck:  No JVD, no thyromegally Lymphatics:  No adenopathy Back:  No CVA tenderness Lungs:  Clear with no wheezes HEART:  Regular rate rhythm, no murmurs, no rubs, no clicks Abd:  soft, positive bowel sounds, no organomegally, no rebound, no guarding Ext:  2 plus pulses, no edema, no cyanosis, no clubbing Skin:  No rashes no nodules Neuro:  CN II through XII intact, motor grossly intact  EKG - sinus brady   Assess/Plan:  1. Autonomic dysfunction - her symptoms are well controlled. She is encouraged to maintain a high sodium diet and daily florinef .     Danelle Waddell COME

## 2024-04-24 NOTE — Patient Instructions (Signed)

## 2024-04-29 MED ORDER — FLUDROCORTISONE ACETATE 0.1 MG PO TABS: 100.0000 ug | ORAL_TABLET | Freq: Two times a day (BID) | ORAL | 3 refills | Status: AC

## 2024-04-29 NOTE — Telephone Encounter (Signed)
 Pt is requesting a refill on non cardiac medication ondansetron . Would Dr. Waddell like to refill this medication? Please address
